# Patient Record
Sex: Male | Born: 1960 | Race: White | Hispanic: No | Marital: Married | Smoking: Never smoker
Health system: Southern US, Community
[De-identification: ages and names within clinical notes are randomized; demographics above are authoritative.]

---

## 2018-06-21 ENCOUNTER — Other Ambulatory Visit: Payer: Self-pay

## 2018-06-21 DIAGNOSIS — I10 Essential (primary) hypertension: Secondary | ICD-10-CM

## 2018-06-21 MED ORDER — HYDROCHLOROTHIAZIDE 50 MG PO TABS: 50.0000 mg | ORAL_TABLET | Freq: Every day | ORAL | 1 refills | Status: DC

## 2018-06-21 NOTE — Telephone Encounter (Signed)
You saw him 03/12/2018 for his blood pressure.  Last physical 09/12/2017 & last labs 03/07/2018.

## 2018-07-08 ENCOUNTER — Other Ambulatory Visit: Payer: Self-pay

## 2018-07-08 DIAGNOSIS — I10 Essential (primary) hypertension: Secondary | ICD-10-CM

## 2018-07-08 MED ORDER — HYDROCHLOROTHIAZIDE 50 MG PO TABS: 50.0000 mg | ORAL_TABLET | Freq: Every day | ORAL | 1 refills | Status: DC

## 2018-07-21 DIAGNOSIS — Z20828 Contact with and (suspected) exposure to other viral communicable diseases: Secondary | ICD-10-CM | POA: Diagnosis not present

## 2018-09-18 ENCOUNTER — Ambulatory Visit: Payer: Managed Care, Other (non HMO)

## 2018-09-18 ENCOUNTER — Other Ambulatory Visit: Payer: Self-pay

## 2018-09-18 DIAGNOSIS — Z01818 Encounter for other preprocedural examination: Secondary | ICD-10-CM

## 2018-09-18 LAB — POCT URINALYSIS DIPSTICK
Bilirubin, UA: NEGATIVE
Blood, UA: NEGATIVE
Glucose, UA: NEGATIVE
Ketones, UA: NEGATIVE
Leukocytes, UA: NEGATIVE
Nitrite, UA: NEGATIVE
Protein, UA: NEGATIVE
Spec Grav, UA: 1.02 (ref 1.010–1.025)
Urobilinogen, UA: 0.2 E.U./dL
pH, UA: 6 (ref 5.0–8.0)

## 2018-09-22 LAB — CMP12+LP+TP+TSH+6AC+PSA+CBC…
ALT: 39 IU/L (ref 0–44)
AST: 30 IU/L (ref 0–40)
Albumin/Globulin Ratio: 1.4 (ref 1.2–2.2)
Albumin: 4 g/dL (ref 3.8–4.9)
Alkaline Phosphatase: 63 IU/L (ref 39–117)
BUN/Creatinine Ratio: 10 (ref 9–20)
BUN: 10 mg/dL (ref 6–24)
Basophils Absolute: 0.1 10*3/uL (ref 0.0–0.2)
Basos: 1 %
Bilirubin Total: 0.4 mg/dL (ref 0.0–1.2)
Calcium: 8.9 mg/dL (ref 8.7–10.2)
Chloride: 99 mmol/L (ref 96–106)
Chol/HDL Ratio: 4 ratio (ref 0.0–5.0)
Cholesterol, Total: 135 mg/dL (ref 100–199)
Creatinine, Ser: 0.97 mg/dL (ref 0.76–1.27)
EOS (ABSOLUTE): 0.1 10*3/uL (ref 0.0–0.4)
Eos: 2 %
Estimated CHD Risk: 0.7 times avg. (ref 0.0–1.0)
Free Thyroxine Index: 2 (ref 1.2–4.9)
GFR calc Af Amer: 99 mL/min/{1.73_m2} (ref >59)
GFR calc non Af Amer: 86 mL/min/{1.73_m2} (ref >59)
GGT: 23 IU/L (ref 0–65)
Globulin, Total: 2.9 g/dL (ref 1.5–4.5)
Glucose: 108 mg/dL — ABNORMAL HIGH (ref 65–99)
HDL: 34 mg/dL — ABNORMAL LOW (ref >39)
Hematocrit: 48 % (ref 37.5–51.0)
Hemoglobin: 16.1 g/dL (ref 13.0–17.7)
Immature Grans (Abs): 0 10*3/uL (ref 0.0–0.1)
Immature Granulocytes: 0 %
Iron: 54 ug/dL (ref 38–169)
LDH: 197 IU/L (ref 121–224)
LDL Chol Calc (NIH): 89 mg/dL (ref 0–99)
Lymphocytes Absolute: 2 10*3/uL (ref 0.7–3.1)
Lymphs: 24 %
MCH: 29.5 pg (ref 26.6–33.0)
MCHC: 33.5 g/dL (ref 31.5–35.7)
MCV: 88 fL (ref 79–97)
Monocytes Absolute: 0.6 10*3/uL (ref 0.1–0.9)
Monocytes: 8 %
Neutrophils Absolute: 5.5 10*3/uL (ref 1.4–7.0)
Neutrophils: 65 %
Phosphorus: 3.7 mg/dL (ref 2.8–4.1)
Platelets: 174 10*3/uL (ref 150–450)
Potassium: 3.5 mmol/L (ref 3.5–5.2)
Prostate Specific Ag, Serum: 5 ng/mL — ABNORMAL HIGH (ref 0.0–4.0)
RBC: 5.45 x10E6/uL (ref 4.14–5.80)
RDW: 13.4 % (ref 11.6–15.4)
Sodium: 139 mmol/L (ref 134–144)
T3 Uptake Ratio: 27 % (ref 24–39)
T4, Total: 7.3 ug/dL (ref 4.5–12.0)
TSH: 1.12 u[IU]/mL (ref 0.450–4.500)
Total Protein: 6.9 g/dL (ref 6.0–8.5)
Triglycerides: 53 mg/dL (ref 0–149)
Uric Acid: 6.1 mg/dL (ref 3.7–8.6)
VLDL Cholesterol Cal: 12 mg/dL (ref 5–40)
WBC: 8.4 10*3/uL (ref 3.4–10.8)

## 2018-09-22 LAB — TESTOSTERONE,FREE AND TOTAL
Testosterone, Free: 18.3 pg/mL (ref 7.2–24.0)
Testosterone: 778 ng/dL (ref 264–916)

## 2018-09-24 ENCOUNTER — Encounter: Payer: Self-pay | Admitting: Internal Medicine

## 2018-09-24 ENCOUNTER — Other Ambulatory Visit: Payer: Self-pay

## 2018-09-24 ENCOUNTER — Ambulatory Visit: Payer: Managed Care, Other (non HMO) | Admitting: Internal Medicine

## 2018-09-24 VITALS — BP 142/77 | HR 66 | Temp 98.1°F | Resp 12 | Ht 69.0 in | Wt 267.0 lb

## 2018-09-24 DIAGNOSIS — J309 Allergic rhinitis, unspecified: Secondary | ICD-10-CM | POA: Insufficient documentation

## 2018-09-24 DIAGNOSIS — Z024 Encounter for examination for driving license: Secondary | ICD-10-CM | POA: Insufficient documentation

## 2018-09-24 DIAGNOSIS — R7301 Impaired fasting glucose: Secondary | ICD-10-CM | POA: Insufficient documentation

## 2018-09-24 DIAGNOSIS — I10 Essential (primary) hypertension: Secondary | ICD-10-CM | POA: Insufficient documentation

## 2018-09-24 DIAGNOSIS — H9192 Unspecified hearing loss, left ear: Secondary | ICD-10-CM | POA: Insufficient documentation

## 2018-09-24 DIAGNOSIS — R972 Elevated prostate specific antigen [PSA]: Secondary | ICD-10-CM | POA: Insufficient documentation

## 2018-09-24 DIAGNOSIS — Z86010 Personal history of colonic polyps: Secondary | ICD-10-CM

## 2018-09-24 DIAGNOSIS — E669 Obesity, unspecified: Secondary | ICD-10-CM | POA: Insufficient documentation

## 2018-09-24 DIAGNOSIS — E291 Testicular hypofunction: Secondary | ICD-10-CM | POA: Insufficient documentation

## 2018-09-24 NOTE — Progress Notes (Signed)
S  - Patient is a 58 y.o. 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 His DOT was ok'ed for 3 months last September due to higher BP's and then re-assessed in November and was certified for one year.  He was seen 03/12/2018 with his BP higher that visit and losartan 25 mg daily added to his regimen of HCTZ (and on K+ supp as well then but not presently). Also educated on importance of lifestyle changes needed with diet, exercise and weight loss as was obese, had prediabetes dx'ed in addition to his HTN An initial f/u 4-6 weeks after that visit was delayed due to Covid, and had not f/u'ed since that March visit.  His PSA was noted to increase on assessment last September and the testosterone supp (gel) was rec'ed to alternate 2 pumps with 3 pumps every other [place] by the provider here at that time. That was not done and he remains on three pumps daily. A follow-up testosterone was not done until the recent labs.   All in all, he has been feeling well. Notes occas LBP's not limiting.  No specific complaints, denies any recent CP, palpitations, SOB, abdominal pains, change in bowel habits, dark/black stools, vision changes (uses reading glasses), recent fevers, or other Covid concerning sx's, denies up at night to urinate frequently, no hesitancy, no LE swelling.  Health history extensive on DOT form and reviewed - toe fracture over 40 years ago noted.  Exercise - no regular exercise Diet - notes tries to watch and eat healthy, not adherent to a very healthy diet and weight has been about the same last 6 months  Allergies  Allergen Reactions  . Naproxen Sodium Nausea Only    Meds reviewed Current Outpatient Medications on File Prior to Visit  Medication Sig Dispense Refill  . hydrochlorothiazide (HYDRODIURIL) 50 MG tablet Take 1 tablet (50 mg total) by mouth daily. 90 tablet 1  . losartan (COZAAR) 25 MG tablet TK 1 T PO QD    . Testosterone 20.25 MG/ACT (1.62%) GEL APP 3 PUMPS QAM  ALTERNATING WITH 2 PUMPS QAM     No current facility-administered medications on file prior to visit.     No tob hx Alcohol use - noted a couple beers/week (trying to decrease some to help with weight), CAGE neg    O - NAD, masked, obese  BP (!) 142/77 (BP Location: Left Arm, Patient Position: Sitting, Cuff Size: Large)   Pulse 66   Temp 98.1 F (36.7 C) (Oral)   Resp 12   Ht 5\' 9"  (1.753 m)   Wt 267 lb (121.1 kg)   SpO2 98%   BMI 39.43 kg/m   Recheck BP - 138/78 manually by me  BP in March - 138/90 manually by me Weight in March - 267.4  Acuity - right 20/40, left 20/30, Both - 20/40 via testing, horizontal field of vision - 80 degrees HEENT - sclera anicteric, PERRL, EOMI, conj - non-inj'ed, No sinus tenderness, TM's and canals clear except mild cerumen noted in canals bilat, pharynx clear  Neck - supple, no adenopathy, no TM, carotids 2+ and = without bruits bilat Car - RRR without m/g/r Pulm- CTA without wheeze or rales Abd - soft, obese, NT, ND, BS+, no obvious HSM, no masses, + ventral hernia (not new), not tender Back - no CVA tenderness Skin- no new lesions of concern on exposed areas, denied otherwise with a mole on his central back without concerning changes noted, scar left knee Ext -  no LE edema, no active joints GU - no swelling in inguinal/suprapubic region, NT, no ing hernia, no testicle lumps or tenderness, prostate exam deferred as will be done by urology on referral with increased PSA noted Neuro - affect was not flat, appropriate with conversation  Grossly non-focal with good strength on testing, sensation intact to LT in distal extremities, DTR's 2+ and = patella, Romberg neg, no pronator drift, good balance on one foot, good finger to nose, good RAMs  Labs reviewed - glu - 108, HDL - 34, TC - 135, LDL - 89, PSA - 5.0 (3.9 in Sept, 2019), H/H - 16.1/48.0, test - 778, free test - 18.3 Hearing screen - OK at low frequencies, hearing decrease at higher freq's  in left ear Vision screen - acuity as above  ECG reviewed - no concerning changes from prior ECG  Colonoscopy screening reviewed - March 2019 last - + serrated adenoma fragments and tubular adenoma, F/U rec'ed in 3 years  Ass/Plan: 1. DOT exam - paperwork completed, copy to be scanned into chart  Certificate given for one year (not two due to HTN)  2. HTN - controlled on medicines at present  Continue medications to manage Discussed goals for good control of BP  Importance of healthy diet and regular aerobic exercise and weight control noted Continue to monitor with K+ low normal and was lower int he past, felt bext to check a BMP in 3 months to recheck  3.  IFG  (prediabetes)  Educated as did last visit as well Importance of diet, aerobic exercise noted, to help with weight management as well  4.  Increased BMI/obesity  Importance of diet modifications and regular aerobic exercise noted Weight loss encouraged  5. Hypogonadism - on test supp, gel   Concern with levels at high ends of normal and concern with increased PSA noted Decrease testosterone to 2 pumps daily at present Await urology rec  6. Increased PSA - referral to urology to help with best next steps, especially noting the increased risks given the above and he was ok with proceeding.  7. Colon polyps - adenomatous tissue noted, rec'ed f/u in 3 years (March 2022)  8. Left hearing loss, not new and lower frequencies ok.   T/C hearing aid eval if more concerning clinically (Ok to qualify for DOT cert)  9. Visual acuity slightly decreased - rec'ed f/u with an eye MD to have further assessment (was ok to qualify for DOT cert.), and he noted he planned to do so (with a different eye MD)  F/u in 3 months with check of a BMP to make sure K+ stable and also can recheck testosterone and free testosterone on the lower dose and await urology input.

## 2018-09-24 NOTE — Patient Instructions (Signed)
Decrease testosterone dose to 2 pumps daily  Follow-up with urology recommended

## 2018-10-07 DIAGNOSIS — Z79899 Other long term (current) drug therapy: Secondary | ICD-10-CM | POA: Diagnosis not present

## 2018-10-07 DIAGNOSIS — E669 Obesity, unspecified: Secondary | ICD-10-CM | POA: Diagnosis not present

## 2018-10-07 DIAGNOSIS — R972 Elevated prostate specific antigen [PSA]: Secondary | ICD-10-CM | POA: Diagnosis not present

## 2018-10-07 DIAGNOSIS — E291 Testicular hypofunction: Secondary | ICD-10-CM | POA: Diagnosis not present

## 2018-10-08 ENCOUNTER — Other Ambulatory Visit: Payer: Self-pay | Admitting: Urology

## 2018-10-08 DIAGNOSIS — R972 Elevated prostate specific antigen [PSA]: Secondary | ICD-10-CM

## 2018-10-16 ENCOUNTER — Other Ambulatory Visit: Payer: Self-pay

## 2018-10-16 ENCOUNTER — Ambulatory Visit
Admission: RE | Admit: 2018-10-16 | Discharge: 2018-10-16 | Disposition: A | Discharge status: Home / Self Care | Payer: 59 | Source: Ambulatory Visit | Attending: Urology | Admitting: Urology

## 2018-10-16 DIAGNOSIS — R972 Elevated prostate specific antigen [PSA]: Secondary | ICD-10-CM | POA: Insufficient documentation

## 2018-10-16 LAB — POCT I-STAT CREATININE: Creatinine, Ser: 1 mg/dL (ref 0.61–1.24)

## 2018-10-16 MED ORDER — GADOBUTROL 1 MMOL/ML IV SOLN
10.0000 mL | Freq: Once | INTRAVENOUS | Status: AC | PRN
  Administered 2018-10-16: 10 mL via INTRAVENOUS

## 2018-10-21 DIAGNOSIS — E291 Testicular hypofunction: Secondary | ICD-10-CM | POA: Diagnosis not present

## 2018-10-21 DIAGNOSIS — R972 Elevated prostate specific antigen [PSA]: Secondary | ICD-10-CM | POA: Diagnosis not present

## 2018-10-21 DIAGNOSIS — N401 Enlarged prostate with lower urinary tract symptoms: Secondary | ICD-10-CM | POA: Diagnosis not present

## 2018-10-21 DIAGNOSIS — Z79899 Other long term (current) drug therapy: Secondary | ICD-10-CM | POA: Diagnosis not present

## 2018-12-17 ENCOUNTER — Other Ambulatory Visit: Payer: Managed Care, Other (non HMO)

## 2018-12-18 ENCOUNTER — Other Ambulatory Visit: Payer: Self-pay

## 2018-12-18 DIAGNOSIS — E291 Testicular hypofunction: Secondary | ICD-10-CM

## 2018-12-18 DIAGNOSIS — R972 Elevated prostate specific antigen [PSA]: Secondary | ICD-10-CM

## 2018-12-18 NOTE — Progress Notes (Addendum)
Scheduled to follow-up with an interim provider on 01/07/2019. We don't have the [facility] Interim Provider schedule for January 2021, so I didn't list the specific provider's name in this note.  Italo informed me that he hasn't been using testosterone get for past 6 weeks due to elevated PSA.  States he's seeing Maryan Puls, MD of [place]. Requested to add PSA & have lab results sent to Dr. Yves Dill.  AMD

## 2018-12-18 NOTE — Addendum Note (Signed)
Addended by: Aliene Altes on: 12/18/2018 08:58 AM   Modules accepted: Orders

## 2018-12-20 LAB — CBC WITH DIFFERENTIAL/PLATELET
Basophils Absolute: 0.1 10*3/uL (ref 0.0–0.2)
Basos: 1 %
EOS (ABSOLUTE): 0.1 10*3/uL (ref 0.0–0.4)
Eos: 2 %
Hematocrit: 45.7 % (ref 37.5–51.0)
Hemoglobin: 16.1 g/dL (ref 13.0–17.7)
Immature Grans (Abs): 0 10*3/uL (ref 0.0–0.1)
Immature Granulocytes: 0 %
Lymphocytes Absolute: 2 10*3/uL (ref 0.7–3.1)
Lymphs: 25 %
MCH: 30.6 pg (ref 26.6–33.0)
MCHC: 35.2 g/dL (ref 31.5–35.7)
MCV: 87 fL (ref 79–97)
Monocytes Absolute: 0.5 10*3/uL (ref 0.1–0.9)
Monocytes: 7 %
Neutrophils Absolute: 5.5 10*3/uL (ref 1.4–7.0)
Neutrophils: 65 %
Platelets: 191 10*3/uL (ref 150–450)
RBC: 5.27 x10E6/uL (ref 4.14–5.80)
RDW: 12.6 % (ref 11.6–15.4)
WBC: 8.3 10*3/uL (ref 3.4–10.8)

## 2018-12-20 LAB — TESTOSTERONE,FREE AND TOTAL
Testosterone, Free: 4.3 pg/mL — ABNORMAL LOW (ref 7.2–24.0)
Testosterone: 195 ng/dL — ABNORMAL LOW (ref 264–916)

## 2019-01-02 ENCOUNTER — Other Ambulatory Visit: Payer: Self-pay

## 2019-01-02 DIAGNOSIS — I10 Essential (primary) hypertension: Secondary | ICD-10-CM

## 2019-01-02 MED ORDER — HYDROCHLOROTHIAZIDE 50 MG PO TABS: 50.0000 mg | ORAL_TABLET | Freq: Every day | ORAL | 1 refills | Status: DC

## 2019-01-07 ENCOUNTER — Other Ambulatory Visit: Payer: Self-pay

## 2019-01-07 ENCOUNTER — Ambulatory Visit: Payer: Self-pay | Admitting: Occupational Medicine

## 2019-01-07 ENCOUNTER — Encounter: Payer: Self-pay | Admitting: Occupational Medicine

## 2019-01-07 VITALS — BP 122/84 | HR 62 | Temp 98.3°F | Resp 12 | Ht 69.0 in | Wt 273.0 lb

## 2019-01-07 NOTE — Progress Notes (Signed)
Epic shows the 12/18/2018 PSA order (# [id number])  but not completed.  Contacted [facility] Customer Service Brett Canales) & they couldn't find the results.  They have the requisition, but failed to complete the test.  No charge for this .  Lab results faxed to Dr. Evelene Croon.  PSA recollected at today's visit.  At the time of the 12/18/2018 lab visit, patient hadn't used Testosterone gel for 6 weeks due to elevated PSA. At today's visit & re-collection he is 9 weeks without the use of testosterone supplementation.  AMD

## 2019-01-09 ENCOUNTER — Encounter: Payer: Self-pay | Admitting: Physician Assistant

## 2019-01-09 ENCOUNTER — Ambulatory Visit: Payer: Self-pay | Admitting: Physician Assistant

## 2019-01-09 ENCOUNTER — Other Ambulatory Visit: Payer: Self-pay

## 2019-01-09 VITALS — BP 150/70 | HR 69 | Temp 99.2°F | Resp 16 | Ht 69.0 in | Wt 273.0 lb

## 2019-01-09 DIAGNOSIS — R972 Elevated prostate specific antigen [PSA]: Secondary | ICD-10-CM

## 2019-01-09 LAB — PSA: Prostate Specific Ag, Serum: 3.8 ng/mL (ref 0.0–4.0)

## 2019-01-09 NOTE — Progress Notes (Signed)
Dr. Anola Gurney is his Urologist. All lab results have been sent to him.  AMD

## 2019-01-09 NOTE — Progress Notes (Signed)
  Subjective:     Patient ID: Paul Pitts, male   DOB: [birth year], 59 y.o.   MRN: [id number]  HPI  59 yo M for lab review Had elevated PSA 5.0 noted 3 months ago with referral to  Urology Dr Evelene Croon for evaluation. It was recognized that he was applying testosterone cream at a significantly higher amount repeatedly.  Feels tired and no energy No exercise     273  pounds Review of Systems     Objective:   Physical Exam VSS Alert and interactive , understands lab concern    Assessment:     PSA 3.8     Plan:     Dr Evelene Croon  To be notified of new lab results Patient to call for dosing instructions  We can draw follow up lab levels as requested  Schedule 3 months f/u

## 2019-01-22 ENCOUNTER — Encounter: Payer: Self-pay | Admitting: Physician Assistant

## 2019-02-04 DIAGNOSIS — Z20828 Contact with and (suspected) exposure to other viral communicable diseases: Secondary | ICD-10-CM | POA: Diagnosis not present

## 2019-03-17 ENCOUNTER — Other Ambulatory Visit: Payer: Self-pay

## 2019-03-17 DIAGNOSIS — I10 Essential (primary) hypertension: Secondary | ICD-10-CM

## 2019-03-17 MED ORDER — LOSARTAN POTASSIUM 25 MG PO TABS: 25.0000 mg | ORAL_TABLET | Freq: Every day | ORAL | 1 refills | Status: DC

## 2019-04-21 DIAGNOSIS — R972 Elevated prostate specific antigen [PSA]: Secondary | ICD-10-CM | POA: Diagnosis not present

## 2019-04-21 DIAGNOSIS — N401 Enlarged prostate with lower urinary tract symptoms: Secondary | ICD-10-CM | POA: Diagnosis not present

## 2019-04-21 DIAGNOSIS — E291 Testicular hypofunction: Secondary | ICD-10-CM | POA: Diagnosis not present

## 2019-04-21 DIAGNOSIS — E6609 Other obesity due to excess calories: Secondary | ICD-10-CM | POA: Diagnosis not present

## 2019-04-21 DIAGNOSIS — Z79899 Other long term (current) drug therapy: Secondary | ICD-10-CM | POA: Diagnosis not present

## 2019-07-09 ENCOUNTER — Other Ambulatory Visit: Payer: Self-pay

## 2019-07-09 DIAGNOSIS — I10 Essential (primary) hypertension: Secondary | ICD-10-CM

## 2019-07-10 MED ORDER — HYDROCHLOROTHIAZIDE 50 MG PO TABS: 50.0000 mg | ORAL_TABLET | Freq: Every day | ORAL | 1 refills | Status: DC

## 2019-09-13 ENCOUNTER — Other Ambulatory Visit: Payer: Self-pay | Admitting: Physician Assistant

## 2019-09-13 DIAGNOSIS — I10 Essential (primary) hypertension: Secondary | ICD-10-CM

## 2019-09-25 DIAGNOSIS — H40003 Preglaucoma, unspecified, bilateral: Secondary | ICD-10-CM | POA: Diagnosis not present

## 2019-10-13 ENCOUNTER — Telehealth: Payer: Self-pay

## 2019-10-13 MED ORDER — HYDROCHLOROTHIAZIDE 50 MG PO TABS: 50.0000 mg | ORAL_TABLET | Freq: Every day | ORAL | 2 refills | Status: DC

## 2019-10-13 NOTE — Telephone Encounter (Signed)
Rx refill request already in Epic - routed to [employer] [employer].  AMD

## 2019-10-22 ENCOUNTER — Other Ambulatory Visit: Payer: Self-pay

## 2019-10-22 ENCOUNTER — Ambulatory Visit: Payer: 59

## 2019-10-22 VITALS — BP 130/84

## 2019-10-22 DIAGNOSIS — E291 Testicular hypofunction: Secondary | ICD-10-CM | POA: Diagnosis not present

## 2019-10-22 DIAGNOSIS — R972 Elevated prostate specific antigen [PSA]: Secondary | ICD-10-CM | POA: Diagnosis not present

## 2019-10-22 DIAGNOSIS — Z0130 Encounter for examination of blood pressure without abnormal findings: Secondary | ICD-10-CM

## 2019-10-22 DIAGNOSIS — N401 Enlarged prostate with lower urinary tract symptoms: Secondary | ICD-10-CM | POA: Diagnosis not present

## 2019-10-29 NOTE — Progress Notes (Signed)
Needs to schedule appt with Provider to complete physical.

## 2019-10-30 ENCOUNTER — Other Ambulatory Visit: Payer: Self-pay

## 2019-10-30 ENCOUNTER — Ambulatory Visit: Payer: Self-pay

## 2019-10-30 DIAGNOSIS — E291 Testicular hypofunction: Secondary | ICD-10-CM

## 2019-10-30 DIAGNOSIS — Z01818 Encounter for other preprocedural examination: Secondary | ICD-10-CM

## 2019-10-30 LAB — POCT URINALYSIS DIPSTICK
Bilirubin, UA: NEGATIVE
Blood, UA: NEGATIVE
Glucose, UA: NEGATIVE
Ketones, UA: NEGATIVE
Leukocytes, UA: NEGATIVE
Nitrite, UA: NEGATIVE
Protein, UA: NEGATIVE
Spec Grav, UA: 1.015 (ref 1.010–1.025)
Urobilinogen, UA: 0.2 E.U./dL
pH, UA: 6 (ref 5.0–8.0)

## 2019-10-30 NOTE — Progress Notes (Signed)
RN to schedule visit for employee physical.

## 2019-10-31 LAB — CMP12+LP+TP+TSH+6AC+PSA+CBC…
ALT: 48 IU/L — ABNORMAL HIGH (ref 0–44)
AST: 35 IU/L (ref 0–40)
Albumin/Globulin Ratio: 1.6 (ref 1.2–2.2)
Albumin: 4.4 g/dL (ref 3.8–4.9)
Alkaline Phosphatase: 82 IU/L (ref 44–121)
BUN/Creatinine Ratio: 11 (ref 10–24)
BUN: 10 mg/dL (ref 8–27)
Basophils Absolute: 0 10*3/uL (ref 0.0–0.2)
Basos: 0 %
Bilirubin Total: 0.6 mg/dL (ref 0.0–1.2)
Calcium: 9.4 mg/dL (ref 8.6–10.2)
Chloride: 95 mmol/L — ABNORMAL LOW (ref 96–106)
Chol/HDL Ratio: 4.6 ratio (ref 0.0–5.0)
Cholesterol, Total: 190 mg/dL (ref 100–199)
Creatinine, Ser: 0.94 mg/dL (ref 0.76–1.27)
EOS (ABSOLUTE): 0.1 10*3/uL (ref 0.0–0.4)
Eos: 1 %
Estimated CHD Risk: 0.9 times avg. (ref 0.0–1.0)
Free Thyroxine Index: 1.9 (ref 1.2–4.9)
GFR calc Af Amer: 101 mL/min/{1.73_m2} (ref >59)
GFR calc non Af Amer: 88 mL/min/{1.73_m2} (ref >59)
GGT: 30 IU/L (ref 0–65)
Globulin, Total: 2.7 g/dL (ref 1.5–4.5)
Glucose: 119 mg/dL — ABNORMAL HIGH (ref 65–99)
HDL: 41 mg/dL (ref >39)
Hematocrit: 47 % (ref 37.5–51.0)
Hemoglobin: 16.3 g/dL (ref 13.0–17.7)
Immature Grans (Abs): 0 10*3/uL (ref 0.0–0.1)
Immature Granulocytes: 0 %
Iron: 80 ug/dL (ref 38–169)
LDH: 224 IU/L (ref 121–224)
LDL Chol Calc (NIH): 132 mg/dL — ABNORMAL HIGH (ref 0–99)
Lymphocytes Absolute: 2.6 10*3/uL (ref 0.7–3.1)
Lymphs: 27 %
MCH: 30 pg (ref 26.6–33.0)
MCHC: 34.7 g/dL (ref 31.5–35.7)
MCV: 86 fL (ref 79–97)
Monocytes Absolute: 0.7 10*3/uL (ref 0.1–0.9)
Monocytes: 8 %
Neutrophils Absolute: 6 10*3/uL (ref 1.4–7.0)
Neutrophils: 64 %
Phosphorus: 3.3 mg/dL (ref 2.8–4.1)
Platelets: 193 10*3/uL (ref 150–450)
Potassium: 3.6 mmol/L (ref 3.5–5.2)
Prostate Specific Ag, Serum: 5 ng/mL — ABNORMAL HIGH (ref 0.0–4.0)
RBC: 5.44 x10E6/uL (ref 4.14–5.80)
RDW: 13.1 % (ref 11.6–15.4)
Sodium: 136 mmol/L (ref 134–144)
T3 Uptake Ratio: 24 % (ref 24–39)
T4, Total: 7.9 ug/dL (ref 4.5–12.0)
TSH: 2.02 u[IU]/mL (ref 0.450–4.500)
Total Protein: 7.1 g/dL (ref 6.0–8.5)
Triglycerides: 92 mg/dL (ref 0–149)
Uric Acid: 6.8 mg/dL (ref 3.8–8.4)
VLDL Cholesterol Cal: 17 mg/dL (ref 5–40)
WBC: 9.6 10*3/uL (ref 3.4–10.8)

## 2019-10-31 LAB — TESTOSTERONE,FREE AND TOTAL
Testosterone, Free: 6.8 pg/mL (ref 6.6–18.1)
Testosterone: 178 ng/dL — ABNORMAL LOW (ref 264–916)

## 2019-11-06 ENCOUNTER — Ambulatory Visit: Payer: Self-pay | Admitting: Physician Assistant

## 2019-11-06 ENCOUNTER — Encounter: Payer: Self-pay | Admitting: Physician Assistant

## 2019-11-06 ENCOUNTER — Other Ambulatory Visit: Payer: Self-pay

## 2019-11-06 VITALS — BP 138/80 | HR 70 | Temp 97.9°F | Resp 16 | Ht 69.0 in | Wt 268.0 lb

## 2019-11-06 DIAGNOSIS — R7309 Other abnormal glucose: Secondary | ICD-10-CM

## 2019-11-06 LAB — POCT GLYCOSYLATED HEMOGLOBIN (HGB A1C): Hemoglobin A1C: 6.7 % — AB (ref 4.0–5.6)

## 2019-11-06 MED ORDER — CYCLOBENZAPRINE HCL 10 MG PO TABS: 10.0000 mg | ORAL_TABLET | Freq: Every day | ORAL | 0 refills | Status: DC

## 2019-11-06 MED ORDER — LIDOCAINE 5 % EX PTCH: 1.0000 | MEDICATED_PATCH | CUTANEOUS | 0 refills | Status: DC

## 2019-11-06 NOTE — Progress Notes (Signed)
   Subjective: Annual physical exam    Patient ID: Paul Pitts, male    DOB: [birth year], 59 y.o.   MRN: [id number]  HPI Patient presents for annual physical exam.  Patient complain of left lateral back pain.  Patient has bladder bowel dysfunction.  Patient denies radicular component to his back pain.   Review of Systems    Hypertension Objective:   Physical Exam No acute distress.  BMI is 39.58.  HEENT is unremarkable.  Neck is supple for adenopathy or bruits.  Lungs are clear to auscultation.  Heart regular rate and rhythm.  Abdomen distended secondary to body habitus.  Normoactive bowel sounds, soft, nontender to palpation.  No obvious deformity to the upper or lower extremities.  Patient has full and equal range of motion of the upper and lower extremities.  No obvious deformity to the cervical lumbar spine.  Patient has full and equal range of motion of the cervical lumbar spine.  Cranial nerves II through XII grossly intact.       Assessment & Plan: Well exam  Discussed lab results with patient showing a PSA of 5.0 and a glucose of 119.  Hemoglobin A1c was 6.7.  Patient elected to trial 6 months diet exercise for hyperglycemia.  Patient advised to follow-up with schedule appointment with urology secondary to elevated PSA.  Patient given prescription for Flexeril to take at night.  Patient also given prescription for Lidoderm patch to use during the [place].  Return if back pain worsen.

## 2019-11-06 NOTE — Progress Notes (Signed)
Pt presents today to complete physical with [name] PA-C.  Paul Pitts

## 2019-12-04 DIAGNOSIS — G4733 Obstructive sleep apnea (adult) (pediatric): Secondary | ICD-10-CM | POA: Diagnosis not present

## 2020-01-20 DIAGNOSIS — G4733 Obstructive sleep apnea (adult) (pediatric): Secondary | ICD-10-CM | POA: Diagnosis not present

## 2020-01-22 ENCOUNTER — Other Ambulatory Visit: Payer: Self-pay

## 2020-01-22 DIAGNOSIS — I10 Essential (primary) hypertension: Secondary | ICD-10-CM

## 2020-01-23 ENCOUNTER — Other Ambulatory Visit: Payer: Self-pay

## 2020-01-23 DIAGNOSIS — I10 Essential (primary) hypertension: Secondary | ICD-10-CM

## 2020-01-23 MED ORDER — HYDROCHLOROTHIAZIDE 50 MG PO TABS: 50.0000 mg | ORAL_TABLET | Freq: Every day | ORAL | 2 refills | Status: DC

## 2020-01-23 MED ORDER — LOSARTAN POTASSIUM 25 MG PO TABS: 25.0000 mg | ORAL_TABLET | Freq: Every day | ORAL | 2 refills | Status: DC

## 2020-02-05 DIAGNOSIS — Z20822 Contact with and (suspected) exposure to covid-19: Secondary | ICD-10-CM | POA: Diagnosis not present

## 2020-02-16 ENCOUNTER — Ambulatory Visit: Payer: Self-pay

## 2020-02-16 DIAGNOSIS — Z20822 Contact with and (suspected) exposure to covid-19: Secondary | ICD-10-CM

## 2020-02-16 NOTE — Progress Notes (Signed)
Presents to [facility] for on-site rapid Covid test.  Wife tested positive 1 1/2 weeks ago.  OOW last week on quarantine.  Had a Covid test at Urgent Care that was negative.  Asymptomatic  Unvaccinated  Rapid Covid Test Results = Negative  AMD

## 2020-02-17 ENCOUNTER — Other Ambulatory Visit: Payer: Self-pay

## 2020-02-17 ENCOUNTER — Encounter: Payer: Self-pay | Admitting: Adult Medicine

## 2020-02-17 ENCOUNTER — Ambulatory Visit: Payer: Self-pay | Admitting: Adult Medicine

## 2020-02-17 VITALS — BP 130/68 | HR 62 | Temp 97.9°F | Resp 14 | Ht 69.0 in | Wt 255.0 lb

## 2020-02-17 DIAGNOSIS — M5410 Radiculopathy, site unspecified: Secondary | ICD-10-CM

## 2020-02-17 DIAGNOSIS — M5442 Lumbago with sciatica, left side: Secondary | ICD-10-CM

## 2020-02-17 MED ORDER — CARISOPRODOL 350 MG PO TABS: 350.0000 mg | ORAL_TABLET | Freq: Four times a day (QID) | ORAL | 0 refills | Status: DC | PRN

## 2020-02-17 MED ORDER — LIDO-CAPSAICIN-MEN-METHYL SAL 0.5-0.035-5-20 % EX PTCH: 1.0000 | MEDICATED_PATCH | Freq: Two times a day (BID) | CUTANEOUS | 1 refills | Status: DC

## 2020-02-17 NOTE — Progress Notes (Unsigned)
Testosterone 264 - 916 ng/dL 178Low  195Low CM  778 CM   Comment: Adult male reference interval is based on a population of  healthy nonobese males (BMI <30) between 16 and 61 years old.  [place], [place] [phone]. PMID: [id number].   Testosterone, Free 6.6 - 18.1 pg/mL 6.8  4.3Low R  18.3 R      Component Ref Range & Units 3 mo ago  (10/30/19) 1 yr ago  (01/07/19) 1 yr ago  (12/18/18) 1 yr ago  (10/16/18) 1 yr ago  (09/18/18)  Glucose 65 - 99 mg/dL 119High     108High   Uric Acid 3.8 - 8.4 mg/dL 6.8     6.1 R, CM   Comment:      Therapeutic target for gout patients: <6.0  BUN 8 - 27 mg/dL 10     10 R   Creatinine, Ser 0.76 - 1.27 mg/dL 0.94    1.00 R  0.97   GFR calc non Af Amer >59 mL/min/1.73 88     86   GFR calc Af Amer >59 mL/min/1.73 101     99   Comment: **In accordance with recommendations from the NKF-ASN Task force,**   [facility] is in the process of updating its eGFR calculation to the   2021 CKD-EPI creatinine equation that estimates kidney function   without a race variable.   BUN/Creatinine Ratio 10 - 24 11     10  R   Sodium 134 - 144 mmol/L 136     139   Potassium 3.5 - 5.2 mmol/L 3.6     3.5   Chloride 96 - 106 mmol/L 95Low     99   Calcium 8.6 - 10.2 mg/dL 9.4     8.9 R   Phosphorus 2.8 - 4.1 mg/dL 3.3     3.7   Total Protein 6.0 - 8.5 g/dL 7.1     6.9   Albumin 3.8 - 4.9 g/dL 4.4     4.0   Globulin, Total 1.5 - 4.5 g/dL 2.7     2.9   Albumin/Globulin Ratio 1.2 - 2.2 1.6     1.4   Bilirubin Total 0.0 - 1.2 mg/dL 0.6     0.4   Alkaline Phosphatase 44 - 121 IU/L 82     63 R   Comment:       **Please note reference interval change**  LDH 121 - 224 IU/L 224     197   AST 0 - 40 IU/L 35     30   ALT 0 - 44 IU/L 48High     39   GGT 0 - 65 IU/L 30     23   Iron 38 - 169 ug/dL 80     54   Cholesterol, Total 100 - 199 mg/dL 190     135   Triglycerides 0 - 149 mg/dL 92     53   HDL >39 mg/dL 41     34Low   VLDL  Cholesterol Cal 5 - 40 mg/dL 17     12   LDL Chol Calc (NIH) 0 - 99 mg/dL 132High     89   Chol/HDL Ratio 0.0 - 5.0 ratio 4.6     4.0 CM   Comment:                 T. Chol/HDL Ratio  Men Women                 1/2 Avg.Risk 3.4  3.3                   Avg.Risk 5.0  4.4                 2X Avg.Risk 9.6  7.1                 3X Avg.Risk 23.4  11.0   Estimated CHD Risk 0.0 - 1.0 times avg. 0.9     0.7 CM   Comment: The CHD Risk is based on the T. Chol/HDL ratio. Other  factors affect CHD Risk such as hypertension, smoking,  diabetes, severe obesity, and family history of  premature CHD.   TSH 0.450 - 4.500 uIU/mL 2.020     1.120   T4, Total 4.5 - 12.0 ug/dL 7.9     7.3   T3 Uptake Ratio 24 - 39 % 24     27   Free Thyroxine Index 1.2 - 4.9 1.9     2.0   Prostate Specific Ag, Serum 0.0 - 4.0 ng/mL 5.0High  3.8 CM    5.0High CM

## 2020-02-17 NOTE — Patient Instructions (Addendum)
Here is some added information alongside of your discussion with DrHolder for Chronic Back Pain When back pain lasts longer than 3 months, it is called chronic back pain. Pain may get worse at certain times (flare-ups). There are things you can do at home to manage your pain. Follow these instructions at home: Pay attention to any changes in your symptoms. Take these actions to help with your pain: Managing pain and stiffness  If told, put ice on the painful area. Your doctor may tell you to use ice for 24-48 hours after the flare-up starts. To do this: ? Put ice in a plastic bag. ? Place a towel between your skin and the bag. ? Leave the ice on for 20 minutes, 2-3 times a [place].  If told, put heat on the painful area. Do this as often as told by your doctor. Use the heat source that your doctor recommends, such as a moist heat pack or a heating pad. ? Place a towel between your skin and the heat source. ? Leave the heat on for 20-30 minutes. ? Take off the heat if your skin turns bright red. This is especially important if you are unable to feel pain, heat, or cold. You may have a greater risk of getting burned.  Soak in a warm bath. This can help relieve pain.      Activity  Avoid bending and other activities that make pain worse.  When standing: ? Keep your upper back and neck straight. ? Keep your shoulders pulled back. ? Avoid slouching.  When sitting: ? Keep your back straight. ? Relax your shoulders. Do not round your shoulders or pull them backward.  Do not sit or stand in one place for long periods of time.  Take short rest breaks during the [place]. Lying down or standing is usually better than sitting. Resting can help relieve pain.  When sitting or lying down for a long time, do some mild activity or stretching. This will help to prevent stiffness and pain.  Get regular exercise. Ask your doctor what activities are safe for you.  Do not lift anything that is heavier  than 10 lb (4.5 kg) or the limit that you are told, until your doctor says that it is safe.  To prevent injury when you lift things: ? Bend your knees. ? Keep the weight close to your body. ? Avoid twisting.     Medicines  Treatment may include medicines for pain and swelling taken by mouth or put on the skin, prescription pain medicine, or muscle relaxants.  Take over-the-counter and prescription medicines only as told by your doctor.  Ask your doctor if the medicine prescribed to you: ? Requires you to avoid driving or using machinery. ? Can cause trouble pooping (constipation). You may need to take these actions to prevent or treat trouble pooping:  Drink enough fluid to keep your pee (urine) pale yellow.  Take over-the-counter or prescription medicines.  Eat foods that are high in fiber. These include beans, whole grains, and fresh fruits and vegetables.  Limit foods that are high in fat and sugars. These include fried or sweet foods. General instructions  Do not use any products that contain nicotine or tobacco, such as cigarettes, e-cigarettes, and chewing tobacco. If you need help quitting, ask your doctor.  Keep all follow-up visits as told by your doctor. This is important.   Get help right away if:  One or both of your legs or feet feel  weak.  One or both of your legs or feet lose feeling (have numbness).  You have trouble controlling when you poop (have a bowel movement) or pee (urinate).  You have bad back pain and: ? You feel like you may vomit (nauseous), or you vomit. ? You have pain in your belly (abdomen). ? You have shortness of breath. ? You faint. Summary  When back pain lasts longer than 3 months, it is called chronic back pain.  Pain may get worse at certain times (flare-ups).  Use ice and heat as told by your doctor. Your doctor may tell you to use ice after flare-ups. This information is not intended to replace advice given to you by your  health care provider. Make sure you discuss any questions you have with your health care provider. Document Revised: 01/29/2019 Document Reviewed: 01/29/2019 Elsevier Patient Education  2021 [employer].

## 2020-02-17 NOTE — Progress Notes (Unsigned)
   Subjective:    Patient ID: Kaitlyn Muto, male    DOB: [birth year], 60 y.o.   MRN: [id number]  HPI  85 y male previously seen in clinic  For htn, osa, lt ear hearing loss  diastassis recti, lt thumb trigger finger Pre-diabetes, works with Urology for hypogonadism testosterone rx, with subsequent elevated psa Presents with back pain he reports has been present for 3-71yr but has not been seen in the clinic Until winter2021 rx with muscle relaxer, nsaids. Later had been somewhat effective until this visit No xrays finding in file     Review of Systems Noncontributory to current hpi, except he has been placed on testosterone But not spoken to any one about hx back pain    Objective:   Physical Exam  Wt 268 located as large abd girth Focused exam Lt psis point tenderness radiating inf. And upward quadratus lumb. Nl rom, strength b/l without radicular sensory change, mild antalgic gain Neg slr sign, flexion abd/add hip nl  Hip extension uncomfortable denies shooting or stabbing back pain        Assessment & Plan:  Low Back Pain  Extends to area of Lt hip  Discussed ordering lumbar film with lt hip   Add soma compd, lido capsaicin patch to nsaid he has been  taking q6 prn.  Xray pending for evaluation  Work restriction- light duty

## 2020-02-18 ENCOUNTER — Ambulatory Visit
Admission: RE | Admit: 2020-02-18 | Discharge: 2020-02-18 | Disposition: A | Discharge status: Home / Self Care | Payer: 59 | Source: Ambulatory Visit | Attending: Adult Medicine | Admitting: Adult Medicine

## 2020-02-18 ENCOUNTER — Other Ambulatory Visit: Payer: Self-pay

## 2020-02-18 DIAGNOSIS — M545 Low back pain, unspecified: Secondary | ICD-10-CM | POA: Diagnosis not present

## 2020-02-20 DIAGNOSIS — G4733 Obstructive sleep apnea (adult) (pediatric): Secondary | ICD-10-CM | POA: Diagnosis not present

## 2020-02-23 ENCOUNTER — Other Ambulatory Visit: Payer: Self-pay | Admitting: Physician Assistant

## 2020-02-23 NOTE — Progress Notes (Signed)
Consult

## 2020-02-23 NOTE — Progress Notes (Signed)
   Subjective:Back pain    Patient ID: Paul Pitts, male    DOB: 1960/05/02, 60 y.o.   MRN: [id number]  HPI Call patient to discuss lumbar spine x-ray   Review of Systems     deferred Objective:   Physical Exam Deferred       Assessment & Plan: Radicular back pain  Consult to [facility] for definited evaluation and Treatment.

## 2020-03-01 ENCOUNTER — Ambulatory Visit (INDEPENDENT_AMBULATORY_CARE_PROVIDER_SITE_OTHER): Payer: 59 | Admitting: Internal Medicine

## 2020-03-01 DIAGNOSIS — Z6839 Body mass index (BMI) 39.0-39.9, adult: Secondary | ICD-10-CM

## 2020-03-01 DIAGNOSIS — G4733 Obstructive sleep apnea (adult) (pediatric): Secondary | ICD-10-CM

## 2020-03-01 DIAGNOSIS — Z7189 Other specified counseling: Secondary | ICD-10-CM | POA: Diagnosis not present

## 2020-03-01 DIAGNOSIS — I10 Essential (primary) hypertension: Secondary | ICD-10-CM

## 2020-03-01 NOTE — Patient Instructions (Signed)

## 2020-03-01 NOTE — Progress Notes (Signed)
[facility] [address] [place], [place] [address]  Pulmonary Sleep Medicine   Office Visit Note  Patient Name: Paul Pitts DOB: 01-20-1960 MRN [id number]    Chief Complaint: Obstructive Sleep Apnea visit  Brief History:  Paul Pitts is seen today for initial consultation. The patient has a 3 month history of sleep apnea. 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  He used to sleep restlessly.  Patient is using PAP nightly.  The patient feels somewhat better after sleeping with PAP.  The patient reports some benefit from PAP use. Reported sleepiness is  The same and the Epworth Sleepiness Score is 5 out of 24. The patient does not take naps. The patient complains of the following: stuffy nose.  The compliance download shows very good compliance with an average use time of 5.3 hours. The AHI is 3.7  The patient does not complain of limb movements disrupting sleep.  ROS  General: (-) fever, (-) chills, (-) night sweat Nose and Sinuses: (-) nasal stuffiness or itchiness, (-) postnasal drip, (-) nosebleeds, (-) sinus trouble. Mouth and Throat: (-) sore throat, (-) hoarseness. Neck: (-) swollen glands, (-) enlarged thyroid, (-) neck pain. Respiratory: - cough, - shortness of breath, - wheezing. Neurologic: + numbness, + tingling. Psychiatric: - anxiety, - depression   Current Medication: Outpatient Encounter Medications as of 03/01/2020  Medication Sig  . carisoprodol (SOMA) 350 MG tablet Take 1 tablet (350 mg total) by mouth 4 (four) times daily as needed for muscle spasms.  . hydrochlorothiazide (HYDRODIURIL) 50 MG tablet Take 1 tablet (50 mg total) by mouth daily.  . Lido-Capsaicin-Men-Methyl Sal 0.5-0.035-5-20 % PTCH Apply 1 patch topically in the morning and at bedtime.  Marland Kitchen losartan (COZAAR) 25 MG tablet Take 1 tablet (25 mg total) by mouth daily.  . Testosterone 20.25 MG/ACT (1.62%) GEL APP 3 PUMPS QAM ALTERNATING WITH 2 PUMPS QAM   No  facility-administered encounter medications on file as of 03/01/2020.    Surgical History: History reviewed. No pertinent surgical history.  Medical History: History reviewed. No pertinent past medical history.  Family History: Non contributory to the present illness  Social History: Social History   Socioeconomic History  . Marital status: Married    Spouse name: Not on file  . Number of children: Not on file  . Years of education: Not on file  . Highest education level: Not on file  Occupational History  . Not on file  Tobacco Use  . Smoking status: Never Smoker  . Smokeless tobacco: Never Used  Substance and Sexual Activity  . Alcohol use: Yes  . Drug use: Not on file  . Sexual activity: Not on file  Other Topics Concern  . Not on file  Social History Narrative  . Not on file   Social Determinants of Health   Financial Resource Strain: Not on file  Food Insecurity: Not on file  Transportation Needs: Not on file  Physical Activity: Not on file  Stress: Not on file  Social Connections: Not on file  Intimate Partner Violence: Not on file    Vital Signs: Blood pressure (!) 155/82, pulse 68, temperature 98.6 F (37 C), temperature source Temporal, resp. rate 16, height 5\' 9"  (1.753 m), weight 265 lb (120.2 kg), SpO2 97 %.  Examination: General Appearance: The patient is well-developed, well-nourished, and in no distress. Neck Circumference: 49 Skin: Gross inspection of skin unremarkable. Head: normocephalic, no gross deformities. Eyes: no gross deformities  noted. ENT: ears appear grossly normal Neurologic: Alert and oriented. No involuntary movements.    EPWORTH SLEEPINESS SCALE:  Scale:  (0)= no chance of dozing; (1)= slight chance of dozing; (2)= moderate chance of dozing; (3)= high chance of dozing  Chance  Situtation    Sitting and reading: 1    Watching TV: 2    Sitting Inactive in public: 0    As a passenger in car: 0      Lying down to  rest: 1    Sitting and talking: 0    Sitting quielty after lunch: 1    In a car, stopped in traffic: 0   TOTAL SCORE:   5 out of 24    SLEEP STUDIES:  1. HST 12/04/19 REI 31 SpO53min 74%   CPAP COMPLIANCE DATA:  Date Range: 01/27/20-02/25/20  Average Daily Use: 5.3 hours  Median Use: 5.4  Compliance for > 4 Hours: 83%  Mask Leak: 27.3  95th Percentile Pressure: 14         LABS: No results found for this or any previous visit (from the past 2160 hour(s)).  Radiology: DG Lumbar Spine 2-3 Views  Result Date: 02/19/2020 CLINICAL DATA:  Left-sided low back pain radiating to left hip 1 year. Symptoms worse over the past 2 weeks. EXAM: LUMBAR SPINE - 2-3 VIEW COMPARISON:  None. FINDINGS: Subtle curvature of the lumbar spine convex right. There is moderate spondylosis of the lumbar spine to include moderate facet arthropathy over the lower lumbar spine. Vertebral body heights are maintained. Mild multilevel disc space narrowing throughout the lumbar spine. No compression fracture or significant spondylolisthesis. Degenerative changes of the sacroiliac joints. IMPRESSION: 1. No acute findings. 2. 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 Electronically Signed   By: Elberta Fortis M.D.   On: 02/19/2020 15:19    No results found.  DG Lumbar Spine 2-3 Views  Result Date: 02/19/2020 CLINICAL DATA:  Left-sided low back pain radiating to left hip 1 year. Symptoms worse over the past 2 weeks. EXAM: LUMBAR SPINE - 2-3 VIEW COMPARISON:  None. FINDINGS: Subtle curvature of the lumbar spine convex right. There is moderate spondylosis of the lumbar spine to include moderate facet arthropathy over the lower lumbar spine. Vertebral body heights are maintained. Mild multilevel disc space narrowing throughout the lumbar spine. No compression fracture or significant spondylolisthesis. Degenerative changes of the sacroiliac joints. IMPRESSION: 1. No acute  findings. 2. 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 Electronically Signed   By: Elberta Fortis M.D.   On: 02/19/2020 15:19      Assessment and Plan: Patient Active Problem List   Diagnosis Date Noted  . Class 2 severe obesity due to excess calories with serious comorbidity in adult (HCC) 02/17/2020  . Hypogonadism in male 09/24/2018  . HTN (hypertension) 09/24/2018  . Increased prostate specific antigen (PSA) velocity 09/24/2018  . IFG (impaired fasting glucose) 09/24/2018  . Obesity 09/24/2018  . Left ear hearing loss 09/24/2018  . Allergic rhinitis 09/24/2018  . Encounter for Department of Transportation (DOT) examination for driving license renewal 46/65/9935  . History of colonic polyps 09/24/2018      The patient does tolerate PAP and reports some benefit from PAP use. The patient is caring for his machine as recommmended and advised to increase time in bed. He is sleeping in his recliner for several hours and he was advised to set a timer in the living room to  prevent sleeping.. The patient was also counselled on the importance of weight loss.. The compliance is very good. The apnea is controlled.   1. OSA (obstructive sleep apnea) continue excellent compliance. Increase time in bed.  2. CPAP use counseling CPAP couseling-Discussed importance of adequate CPAP use as well as proper care and cleaning techniques of machine and all supplies.  3. Primary hypertension Elevated in office. Continue to monitor and f/u with PCP.  4. Class 2 severe obesity due to excess calories with serious comorbidity and body mass index (BMI) of 39.0 to 39.9 in adult [facility]) Obesity Counseling: Had a lengthy discussion regarding patients BMI and weight issues. Patient was instructed on portion control as well as increased activity. Also discussed caloric restrictions with trying to maintain intake less than 2000 Kcal. Discussions were made in accordance with the 5As  of weight management. Simple actions such as not eating late and if able to, taking a walk is suggested.   General Counseling: I have discussed the findings of the evaluation and examination with Paul Hua.  I have also discussed any further diagnostic evaluation thatmay be needed or ordered today. Paul Hua verbalizes understanding of the findings of todays visit. We also reviewed his medications today and discussed drug interactions and side effects including but not limited excessive drowsiness and altered mental states. We also discussed that there is always a risk not just to him but also people around him. he has been encouraged to call the office with any questions or concerns that should arise related to todays visit.  No orders of the defined types were placed in this encounter.       I have personally obtained a history, examined the patient, evaluated laboratory and imaging results, formulated the assessment and plan and placed orders.  This patient was seen by Lynn Ito, PA-C in collaboration with Dr. Freda Munro as a part of collaborative care agreement.   Valentino Hue Sol Blazing, PhD, FAASM  Diplomate, American Board of Sleep Medicine    Yevonne Pax, MD [facility] Diplomate ABMS Pulmonary and Critical Care Medicine Sleep medicine

## 2020-03-08 DIAGNOSIS — M545 Low back pain, unspecified: Secondary | ICD-10-CM | POA: Diagnosis not present

## 2020-03-19 DIAGNOSIS — G4733 Obstructive sleep apnea (adult) (pediatric): Secondary | ICD-10-CM | POA: Diagnosis not present

## 2020-03-24 DIAGNOSIS — M545 Low back pain, unspecified: Secondary | ICD-10-CM | POA: Diagnosis not present

## 2020-03-30 DIAGNOSIS — M545 Low back pain, unspecified: Secondary | ICD-10-CM | POA: Diagnosis not present

## 2020-03-30 DIAGNOSIS — G4733 Obstructive sleep apnea (adult) (pediatric): Secondary | ICD-10-CM | POA: Diagnosis not present

## 2020-04-08 DIAGNOSIS — M545 Low back pain, unspecified: Secondary | ICD-10-CM | POA: Diagnosis not present

## 2020-04-15 DIAGNOSIS — M545 Low back pain, unspecified: Secondary | ICD-10-CM | POA: Diagnosis not present

## 2020-04-19 DIAGNOSIS — G4733 Obstructive sleep apnea (adult) (pediatric): Secondary | ICD-10-CM | POA: Diagnosis not present

## 2020-04-21 DIAGNOSIS — M545 Low back pain, unspecified: Secondary | ICD-10-CM | POA: Diagnosis not present

## 2020-04-26 DIAGNOSIS — N401 Enlarged prostate with lower urinary tract symptoms: Secondary | ICD-10-CM | POA: Diagnosis not present

## 2020-04-26 DIAGNOSIS — R972 Elevated prostate specific antigen [PSA]: Secondary | ICD-10-CM | POA: Diagnosis not present

## 2020-04-26 DIAGNOSIS — E291 Testicular hypofunction: Secondary | ICD-10-CM | POA: Diagnosis not present

## 2020-04-26 DIAGNOSIS — N5201 Erectile dysfunction due to arterial insufficiency: Secondary | ICD-10-CM | POA: Diagnosis not present

## 2020-05-10 DIAGNOSIS — M545 Low back pain, unspecified: Secondary | ICD-10-CM | POA: Diagnosis not present

## 2020-05-13 DIAGNOSIS — M545 Low back pain, unspecified: Secondary | ICD-10-CM | POA: Diagnosis not present

## 2020-05-13 DIAGNOSIS — Z01818 Encounter for other preprocedural examination: Secondary | ICD-10-CM | POA: Diagnosis not present

## 2020-05-19 DIAGNOSIS — G4733 Obstructive sleep apnea (adult) (pediatric): Secondary | ICD-10-CM | POA: Diagnosis not present

## 2020-05-25 DIAGNOSIS — E291 Testicular hypofunction: Secondary | ICD-10-CM | POA: Diagnosis not present

## 2020-05-25 DIAGNOSIS — Z79899 Other long term (current) drug therapy: Secondary | ICD-10-CM | POA: Diagnosis not present

## 2020-05-25 DIAGNOSIS — D4010 Neoplasm of uncertain behavior of unspecified testis: Secondary | ICD-10-CM | POA: Diagnosis not present

## 2020-05-26 DIAGNOSIS — R972 Elevated prostate specific antigen [PSA]: Secondary | ICD-10-CM | POA: Diagnosis not present

## 2020-05-26 DIAGNOSIS — Z79899 Other long term (current) drug therapy: Secondary | ICD-10-CM | POA: Diagnosis not present

## 2020-05-26 DIAGNOSIS — E291 Testicular hypofunction: Secondary | ICD-10-CM | POA: Diagnosis not present

## 2020-06-09 DIAGNOSIS — M5416 Radiculopathy, lumbar region: Secondary | ICD-10-CM | POA: Diagnosis not present

## 2020-06-19 DIAGNOSIS — G4733 Obstructive sleep apnea (adult) (pediatric): Secondary | ICD-10-CM | POA: Diagnosis not present

## 2020-06-26 NOTE — Progress Notes (Signed)
[facility] [address] [place], [place] [address]  Pulmonary Sleep Medicine   Office Visit Note  Patient Name: Paul Pitts DOB: February 23, 1960 MRN [id number]    Chief Complaint: Obstructive Sleep Apnea visit  Brief History:  Paul Pitts is seen today for 4 month follow-up consult on previous initial evaluation of APAP therapy @ 5 - 20 cmH2O.  The patient has a 7 month history of sleep apnea and prior to therapy, patient had a history of snoring & restless sleep.   Patient mostly using PAP nightly.  The patient feels better after sleeping with PAP and reports benefiting from PAP use.  Epworth Sleepiness Score is 2 out of 24. The patient never take naps. The patient complains of the following: chronic back pain  The compliance download shows an average use time of 4:56 hours @ 84%. With full face mask. The AHI is 2.1  The patient does not complain of limb movements disrupting sleep.  ROS  General: (-) fever, (-) chills, (-) night sweat Nose and Sinuses: (-) nasal stuffiness or itchiness, (-) postnasal drip, (-) nosebleeds, (-) sinus trouble. Mouth and Throat: (-) sore throat, (-) hoarseness. Neck: (-) swollen glands, (-) enlarged thyroid, (-) neck pain. Respiratory: - cough, - shortness of breath, - wheezing. Neurologic: - numbness, - tingling. Psychiatric: - anxiety, - depression   Current Medication: Outpatient Encounter Medications as of 06/28/2020  Medication Sig   dutasteride (AVODART) 0.5 MG capsule Take 0.5 mg by mouth daily.   hydrochlorothiazide (HYDRODIURIL) 50 MG tablet Take 1 tablet (50 mg total) by mouth daily.   Lido-Capsaicin-Men-Methyl Sal 0.5-0.035-5-20 % PTCH Apply 1 patch topically in the morning and at bedtime.   losartan (COZAAR) 25 MG tablet Take 1 tablet (25 mg total) by mouth daily.   meloxicam (MOBIC) 7.5 MG tablet Take 7.5 mg by mouth daily.   Testosterone 20.25 MG/ACT (1.62%) GEL APP 3 PUMPS QAM ALTERNATING WITH 2 PUMPS QAM   [DISCONTINUED]  carisoprodol (SOMA) 350 MG tablet Take 1 tablet (350 mg total) by mouth 4 (four) times daily as needed for muscle spasms.   No facility-administered encounter medications on file as of 06/28/2020.    Surgical History: History reviewed. No pertinent surgical history.  Medical History: History reviewed. No pertinent past medical history.  Family History: Non contributory to the present illness  Social History: Social History   Socioeconomic History   Marital status: Married    Spouse name: Not on file   Number of children: Not on file   Years of education: Not on file   Highest education level: Not on file  Occupational History   Not on file  Tobacco Use   Smoking status: Never   Smokeless tobacco: Never  Substance and Sexual Activity   Alcohol use: Yes   Drug use: Not on file   Sexual activity: Not on file  Other Topics Concern   Not on file  Social History Narrative   Not on file   Social Determinants of Health   Financial Resource Strain: Not on file  Food Insecurity: Not on file  Transportation Needs: Not on file  Physical Activity: Not on file  Stress: Not on file  Social Connections: Not on file  Intimate Partner Violence: Not on file    Vital Signs: Blood pressure (!) 160/89, pulse 66, resp. rate 18, height 5\' 8"  (1.727 m), weight 270 lb 14.4 oz (122.9 kg), SpO2 98 %.  Examination: General Appearance: The patient is well-developed, well-nourished, and in no distress.  Neck Circumference: 49 Skin: Gross inspection of skin unremarkable. Head: normocephalic, no gross deformities. Eyes: no gross deformities noted. ENT: ears appear grossly normal Neurologic: Alert and oriented. No involuntary movements.    EPWORTH SLEEPINESS SCALE:  Scale:  (0)= no chance of dozing; (1)= slight chance of dozing; (2)= moderate chance of dozing; (3)= high chance of dozing  Chance  Situtation    Sitting and reading: 1    Watching TV: 1    Sitting Inactive in public:  0    As a passenger in car: 0      Lying down to rest: 0    Sitting and talking: 0    Sitting quielty after lunch: 1    In a car, stopped in traffic: 0   TOTAL SCORE:   2 out of 24    SLEEP STUDIES:  HST 12/04/19 - RDI 30.6,  Low SpO2 74%   CPAP COMPLIANCE DATA:  Date Range: 02/26/20 - 06/24/20  Average Daily Use: 4:56 hours  Median Use: 5:05 hours  Compliance for > 4 Hours: 84%  AHI: 2.1 respiratory events per hour  Days Used: 113/120 days  Mask Leak: 22.9 Lpm  95th Percentile Pressure: 15.6 Lpm         LABS: No results found for this or any previous visit (from the past 2160 hour(s)).  Radiology: DG Lumbar Spine 2-3 Views  Result Date: 02/19/2020 CLINICAL DATA:  Left-sided low back pain radiating to left hip 1 year. Symptoms worse over the past 2 weeks. EXAM: LUMBAR SPINE - 2-3 VIEW COMPARISON:  None. FINDINGS: Subtle curvature of the lumbar spine convex right. There is moderate spondylosis of the lumbar spine to include moderate facet arthropathy over the lower lumbar spine. Vertebral body heights are maintained. Mild multilevel disc space narrowing throughout the lumbar spine. No compression fracture or significant spondylolisthesis. Degenerative changes of the sacroiliac joints. IMPRESSION: 1. No acute findings. 2. 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 Electronically Signed   By: Elberta Fortis M.D.   On: 02/19/2020 15:19    No results found.  No results found.    Assessment and Plan: Patient Active Problem List   Diagnosis Date Noted   OSA on CPAP 06/28/2020   CPAP use counseling 06/28/2020   Morbid obesity (HCC) 06/28/2020   Class 2 severe obesity due to excess calories with serious comorbidity in adult [facility]) 02/17/2020   Hypogonadism in male 09/24/2018   HTN (hypertension) 09/24/2018   Increased prostate specific antigen (PSA) velocity 09/24/2018   IFG (impaired fasting glucose) 09/24/2018   Obesity  09/24/2018   Left ear hearing loss 09/24/2018   Allergic rhinitis 09/24/2018   Encounter for Department of Transportation (DOT) examination for driving license renewal 55/73/2202   History of colonic polyps 09/24/2018   1. OSA on CPAP The patient does tolerate PAP and reports  benefit from PAP use. He is compliant but does fall asleep in the recliner and as a result only gets about 5 hours of use nightly. Suggested he set an alarm to wake up and move to the bed and use the cpap. The patient was reminded how to clean equipment  and advised to replace supplies routinely . The patient was also counselled on weight loss . The compliance is very good . The AHI is 2.1.   OSA- continue very good compliance- increase time in bed. F/u in one year.    2. CPAP use counseling CPAP Counseling: had a lengthy discussion with  the patient regarding the importance of PAP therapy in management of the sleep apnea. Patient appears to understand the risk factor reduction and also understands the risks associated with untreated sleep apnea. Patient will try to make a good faith effort to remain compliant with therapy. Also instructed the patient on proper cleaning of the device including the water must be changed daily if possible and use of distilled water is preferred. Patient understands that the machine should be regularly cleaned with appropriate recommended cleaning solutions that do not damage the PAP machine for example given white vinegar and water rinses. Other methods such as ozone treatment may not be as good as these simple methods to achieve cleaning.   3. Morbid obesity (HCC) Obesity Counseling: Had a lengthy discussion regarding patients BMI and weight issues. Patient was instructed on portion control as well as increased activity. Also discussed caloric restrictions with trying to maintain intake less than 2000 Kcal. Discussions were made in accordance with the 5As of weight management. Simple actions  such as not eating late and if able to, taking a walk is suggested.   4. Primary hypertension Hypertension Counseling:   The following hypertensive lifestyle modification were recommended and discussed:  1. Limiting alcohol intake to less than 1 oz/Lobue of ethanol:(24 oz of beer or 8 oz of wine or 2 oz of 100-proof whiskey). 2. Take baby ASA 81 mg daily. 3. Importance of regular aerobic exercise and losing weight. 4. Reduce dietary saturated fat and cholesterol intake for overall cardiovascular health. 5. Maintaining adequate dietary potassium, calcium, and magnesium intake. 6. Regular monitoring of the blood pressure. 7. Reduce sodium intake to less than 100 mmol/[place] (less than 2.3 gm of sodium or less than 6 gm of sodium choride)      General Counseling: I have discussed the findings of the evaluation and examination with Paul Hua.  I have also discussed any further diagnostic evaluation thatmay be needed or ordered today. Paul Hua verbalizes understanding of the findings of todays visit. We also reviewed his medications today and discussed drug interactions and side effects including but not limited excessive drowsiness and altered mental states. We also discussed that there is always a risk not just to him but also people around him. he has been encouraged to call the office with any questions or concerns that should arise related to todays visit.  No orders of the defined types were placed in this encounter.       I have personally obtained a history, examined the patient, evaluated laboratory and imaging results, formulated the assessment and plan and placed orders.  This patient was seen today by Emmaline Kluver, PA-C in collaboration with Dr. Freda Munro.   Yevonne Pax, MD [facility] Diplomate ABMS Pulmonary and Critical Care Medicine Sleep medicine

## 2020-06-28 ENCOUNTER — Ambulatory Visit (INDEPENDENT_AMBULATORY_CARE_PROVIDER_SITE_OTHER): Payer: 59 | Admitting: Internal Medicine

## 2020-06-28 VITALS — BP 160/89 | HR 66 | Resp 18 | Ht 68.0 in | Wt 270.9 lb

## 2020-06-28 DIAGNOSIS — Z9989 Dependence on other enabling machines and devices: Secondary | ICD-10-CM

## 2020-06-28 DIAGNOSIS — G4733 Obstructive sleep apnea (adult) (pediatric): Secondary | ICD-10-CM | POA: Insufficient documentation

## 2020-06-28 DIAGNOSIS — I10 Essential (primary) hypertension: Secondary | ICD-10-CM | POA: Diagnosis not present

## 2020-06-28 DIAGNOSIS — Z7189 Other specified counseling: Secondary | ICD-10-CM | POA: Insufficient documentation

## 2020-06-28 NOTE — Patient Instructions (Signed)

## 2020-07-13 ENCOUNTER — Telehealth: Payer: Self-pay

## 2020-07-13 NOTE — Telephone Encounter (Signed)
Spoke with pt today. York Spaniel he is see Dr. Okey Regal for back pain. He will be signing a release for them to send records.

## 2020-07-14 DIAGNOSIS — M5416 Radiculopathy, lumbar region: Secondary | ICD-10-CM | POA: Diagnosis not present

## 2020-07-19 DIAGNOSIS — G4733 Obstructive sleep apnea (adult) (pediatric): Secondary | ICD-10-CM | POA: Diagnosis not present

## 2020-08-19 DIAGNOSIS — G4733 Obstructive sleep apnea (adult) (pediatric): Secondary | ICD-10-CM | POA: Diagnosis not present

## 2020-08-31 ENCOUNTER — Encounter: Payer: Self-pay | Admitting: Physician Assistant

## 2020-08-31 ENCOUNTER — Other Ambulatory Visit: Payer: Self-pay

## 2020-08-31 ENCOUNTER — Ambulatory Visit: Payer: Self-pay | Admitting: Physician Assistant

## 2020-08-31 VITALS — BP 139/86 | HR 73 | Temp 97.7°F | Resp 14 | Ht 69.0 in | Wt 265.0 lb

## 2020-08-31 DIAGNOSIS — G8929 Other chronic pain: Secondary | ICD-10-CM

## 2020-08-31 NOTE — Progress Notes (Signed)
   Subjective: Chronic back pain    Patient ID: Paul Pitts, male    DOB: [birth year], 60 y.o.   MRN: [id number]  HPI Patient presents for follow-up of chronic back pain for 6 months.  Patient has been evaluated and treated by [facility] since February 2022.  Patient back pain is secondary to moderate to severe degenerative changes of the lumbar spine.  This was confirmed with x-rays and MRI.  No radicular component to the lumbar spine.  No bladder or bowel dysfunction.  Patient has been refractory to physical therapy, NSAIDs, and muscle relaxants.  Patient had a transforaminal injection which decreased his back pain to a 4/10.  Patient has performed light duty since February 2022 but was told last week that they have no bruits that he can perform.  Patient is scheduled for a nerve block middle of September 2022.  Patient is hoping that this will allow him to return back to normal duties.   Review of Systems Hypertension, hypogonadism, and sleep apnea.    Objective:   Physical Exam No acute distress.  Temperature 97.7, pulse 73, respiration 14, BP is 139/86, patient is 96% O2 sat on room air.  Patient weighs 265 pounds and BMI is 39.1. Patient is walking for normal gait.  No obvious deformity of the lumbar spine.  No guarding with palpation of lumbar spine.  Patient has full Nikkel range of motion of the hip and knees.  Patient has negative straight leg test in supine position.  DTRs 2+ without clonus       Assessment & Plan: Chronic low back pain  Patient return back to light duty.  We have asked [facility] to prepare a list of limitations for job accommodation.  Patient advised to follow-up monthly pending resolution of his back pain.

## 2020-08-31 NOTE — Progress Notes (Signed)
Pt states he's scheduled for a nerve block September 19th.

## 2020-09-02 ENCOUNTER — Telehealth: Payer: Self-pay

## 2020-09-02 DIAGNOSIS — Z7689 Persons encountering health services in other specified circumstances: Secondary | ICD-10-CM

## 2020-09-02 NOTE — Telephone Encounter (Signed)
Returning patient to [facility] for Return to Work Evaluation.    Janace Aris, MD Sibyl Parr, MD [place], PT  AMD

## 2020-09-19 DIAGNOSIS — G4733 Obstructive sleep apnea (adult) (pediatric): Secondary | ICD-10-CM | POA: Diagnosis not present

## 2020-09-20 DIAGNOSIS — M545 Low back pain, unspecified: Secondary | ICD-10-CM | POA: Diagnosis not present

## 2020-09-20 DIAGNOSIS — M47817 Spondylosis without myelopathy or radiculopathy, lumbosacral region: Secondary | ICD-10-CM | POA: Diagnosis not present

## 2020-09-22 DIAGNOSIS — M47817 Spondylosis without myelopathy or radiculopathy, lumbosacral region: Secondary | ICD-10-CM | POA: Diagnosis not present

## 2020-09-27 DIAGNOSIS — H40003 Preglaucoma, unspecified, bilateral: Secondary | ICD-10-CM | POA: Diagnosis not present

## 2020-09-29 ENCOUNTER — Encounter: Payer: Self-pay | Admitting: Physician Assistant

## 2020-09-29 ENCOUNTER — Ambulatory Visit: Payer: Self-pay | Admitting: Physician Assistant

## 2020-09-29 ENCOUNTER — Other Ambulatory Visit: Payer: Self-pay

## 2020-09-29 VITALS — BP 138/80 | HR 66 | Temp 97.6°F | Resp 14 | Ht 69.0 in | Wt 260.0 lb

## 2020-09-29 DIAGNOSIS — G8929 Other chronic pain: Secondary | ICD-10-CM

## 2020-09-29 NOTE — Progress Notes (Signed)
Pt states for right now since his shot Wednesday he's been doing ok with his back./CL,RMA

## 2020-09-29 NOTE — Progress Notes (Signed)
   Subjective: Chronic back pain    Patient ID: Paul Pitts, male    DOB: Apr 09, 1960, 60 y.o.   MRN: [id number]  HPI Patient follow-up for chronic back pain.  Patient was referred to pain management through [facility].  Patient states pain management doctor recommended injections in his back.    Patient states he noticed moderate relief status post first injection given on 09/23/2020.  Patient unsure of next scheduled visit.  Patient is working modified duties.  Denies bladder or bowel dysfunction.  Patient said he is considering retiring in the near future.   Review of Systems BPH, hypogonadism, hypertension, obesity, and sleep apnea.    Objective:   Physical Exam No acute distress.  Patient has normal gait for flexed posture when ambulating.  Temperature is 97.6, pulse 66, respiration 14, BP is 130/80, patient 90% O2 sat on room air.  Patient weighs 260 pounds and BMI is 38.40. No obvious lumbar deformity.  Patient is moderate guarding palpation L4-S1.  Patient has decreased range of motion all fields.  Patient has negative straight leg test in supine position.    Assessment & Plan: Chronic back pain.   Patient will continue modified duty and follow-up with [facility] pain management department.  Advised follow-up biweekly.

## 2020-10-13 ENCOUNTER — Ambulatory Visit: Payer: Self-pay | Admitting: Physician Assistant

## 2020-10-13 ENCOUNTER — Encounter: Payer: Self-pay | Admitting: Physician Assistant

## 2020-10-13 ENCOUNTER — Other Ambulatory Visit: Payer: Self-pay

## 2020-10-13 VITALS — BP 118/70 | HR 72 | Temp 98.0°F | Resp 14 | Ht 69.0 in | Wt 260.0 lb

## 2020-10-13 DIAGNOSIS — M5442 Lumbago with sciatica, left side: Secondary | ICD-10-CM

## 2020-10-13 NOTE — Progress Notes (Signed)
Pt  states the pain is still the same;No better.Gretel Acre

## 2020-10-13 NOTE — Progress Notes (Signed)
   Subjective: Chronic back pain    Patient ID: Paul Pitts, male    DOB: 11-07-1960, 60 y.o.   MRN: [id number]  HPI Patient follow-up for chronic back pain.  Patient is now in the pain management unit of [facility].  Patient states 2 weeks ago he took a injection in his back which had only mild relief for 3 days.  Patient state he is scheduled 10/18/2020 for another injection.  Patient is a continue to have radicular component from his low back left buttocks.  Denies bladder or bowel dysfunction.   Review of Systems Hypertension, hypogonadism, sleep apnea.    Objective:   Physical Exam No acute distress.  No obvious lumbar spine deformity.  Patient has moderate guarding from L3-S1.  Patient has decreased range of motion in all fields.       Assessment & Plan: Chronic back pain with radiculopathy.   Patient will continue pain management [facility].  Follow-up in 2 weeks.

## 2020-10-18 DIAGNOSIS — M47817 Spondylosis without myelopathy or radiculopathy, lumbosacral region: Secondary | ICD-10-CM | POA: Diagnosis not present

## 2020-10-18 DIAGNOSIS — M5416 Radiculopathy, lumbar region: Secondary | ICD-10-CM | POA: Diagnosis not present

## 2020-10-18 DIAGNOSIS — M545 Low back pain, unspecified: Secondary | ICD-10-CM | POA: Diagnosis not present

## 2020-10-19 DIAGNOSIS — G4733 Obstructive sleep apnea (adult) (pediatric): Secondary | ICD-10-CM | POA: Diagnosis not present

## 2020-10-27 ENCOUNTER — Encounter: Payer: Self-pay | Admitting: Physician Assistant

## 2020-10-27 ENCOUNTER — Other Ambulatory Visit: Payer: Self-pay

## 2020-10-27 ENCOUNTER — Ambulatory Visit: Payer: Self-pay | Admitting: Physician Assistant

## 2020-10-27 VITALS — BP 141/76 | HR 62 | Temp 97.3°F | Resp 14 | Wt 267.0 lb

## 2020-10-27 DIAGNOSIS — M5442 Lumbago with sciatica, left side: Secondary | ICD-10-CM

## 2020-10-27 NOTE — Progress Notes (Signed)
   Subjective: Chronic back pain    Patient ID: Paul Pitts, male    DOB: 07/18/1960, 60 y.o.   MRN: [id number]  HPI Patient follow-up for chronic back pain.  Patient is being followed by orthopedics at [facility].  This is patient 2-week follow-up.  Patient state he received only mild relief TFESI.  It was decided that his next injection will be at a lower level than previous injection.  He is scheduled for this injection on 11/10/2020.  Patient is also scheduled for follow-up with orthopedic doctor 4 weeks after injection for reevaluation.  If still refractory will be considered surgical case.  Patient also will restart physical therapy.  Patient states continues to work with limitations at his job.  Patient denies bladder or bowel dysfunction.  Continues to have mild radicular component.   Review of Systems Chronic back pain, hypogonadism, obesity, and sleep apnea,    Objective:   Physical Exam No acute distress.  Examination of the back reveals no obvious deformity.  Patient does sit and stand with reliance on upper extremities.  Patient has moderate guarding with palpation of L3-S1.  Patient has decreased range of motion is all fields.  Patient has negative straight leg test in supine position.  DTRs are 2+ without clonus.       Assessment & Plan: Chronic back pain.   Patient return back to work with restrictions of no lifting greater than 20 pounds.  Patient will follow-up with [facility] with PT sessions and pending TFESI on 11/20/2020.  Patient will continue to follow-up every 2 weeks.

## 2020-10-27 NOTE — Progress Notes (Signed)
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

## 2020-11-10 DIAGNOSIS — M5416 Radiculopathy, lumbar region: Secondary | ICD-10-CM | POA: Diagnosis not present

## 2020-11-22 DIAGNOSIS — Z79899 Other long term (current) drug therapy: Secondary | ICD-10-CM | POA: Diagnosis not present

## 2020-11-22 DIAGNOSIS — R972 Elevated prostate specific antigen [PSA]: Secondary | ICD-10-CM | POA: Diagnosis not present

## 2020-11-22 DIAGNOSIS — E291 Testicular hypofunction: Secondary | ICD-10-CM | POA: Diagnosis not present

## 2020-11-24 DIAGNOSIS — Z79899 Other long term (current) drug therapy: Secondary | ICD-10-CM | POA: Diagnosis not present

## 2020-11-24 DIAGNOSIS — N401 Enlarged prostate with lower urinary tract symptoms: Secondary | ICD-10-CM | POA: Diagnosis not present

## 2020-11-24 DIAGNOSIS — E291 Testicular hypofunction: Secondary | ICD-10-CM | POA: Diagnosis not present

## 2020-11-29 DIAGNOSIS — M47817 Spondylosis without myelopathy or radiculopathy, lumbosacral region: Secondary | ICD-10-CM | POA: Diagnosis not present

## 2020-11-29 DIAGNOSIS — M545 Low back pain, unspecified: Secondary | ICD-10-CM | POA: Diagnosis not present

## 2020-11-29 DIAGNOSIS — M25552 Pain in left hip: Secondary | ICD-10-CM | POA: Diagnosis not present

## 2020-11-29 DIAGNOSIS — M5416 Radiculopathy, lumbar region: Secondary | ICD-10-CM | POA: Diagnosis not present

## 2021-01-31 DIAGNOSIS — M545 Low back pain, unspecified: Secondary | ICD-10-CM | POA: Diagnosis not present

## 2021-01-31 DIAGNOSIS — M5416 Radiculopathy, lumbar region: Secondary | ICD-10-CM | POA: Diagnosis not present

## 2021-01-31 DIAGNOSIS — M25552 Pain in left hip: Secondary | ICD-10-CM | POA: Diagnosis not present

## 2021-02-16 DIAGNOSIS — M5416 Radiculopathy, lumbar region: Secondary | ICD-10-CM | POA: Diagnosis not present

## 2021-03-15 DIAGNOSIS — M5416 Radiculopathy, lumbar region: Secondary | ICD-10-CM | POA: Diagnosis not present

## 2021-03-15 DIAGNOSIS — M25552 Pain in left hip: Secondary | ICD-10-CM | POA: Diagnosis not present

## 2021-03-15 DIAGNOSIS — M545 Low back pain, unspecified: Secondary | ICD-10-CM | POA: Diagnosis not present

## 2021-04-01 IMAGING — MR MR PROSTATE WO/W CM
56 series · 56 of 56 positions shown · IV contrast (10ml Gadavist)
Comparison: None.

CLINICAL DATA: No surgery NKI No biopsy No Hx prostatitis No prior
imaging Patient completed pre exam prep Rising PSA Went from 3.9 to
5.0 in 1 years time^10mL GADAVIST GADOBUTROL 1 MMOL/ML IV SOLN

EXAM:
MR PROSTATE WITHOUT AND WITH CONTRAST
TECHNIQUE: Multiplanar multisequence MRI images were obtained of the pelvis
centered about the prostate. Pre and post contrast images were
obtained.
CONTRAST:  10mL GADAVIST GADOBUTROL 1 MMOL/ML IV SOLN

[Series 3: T1 · axial · 8.0mm · 0.74mm/px · 1 of 25 slices shown (1 of 48)]
[im 1/25]
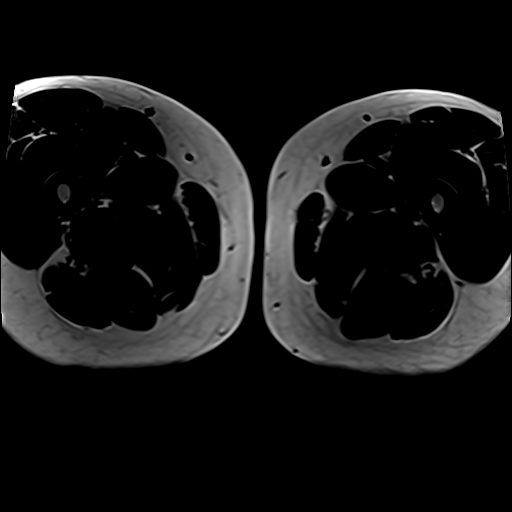

[Series 4: bSSFP fat-sat · axial · 8.0mm · 0.74mm/px · 1 of 25 slices shown]
[im 1/25]
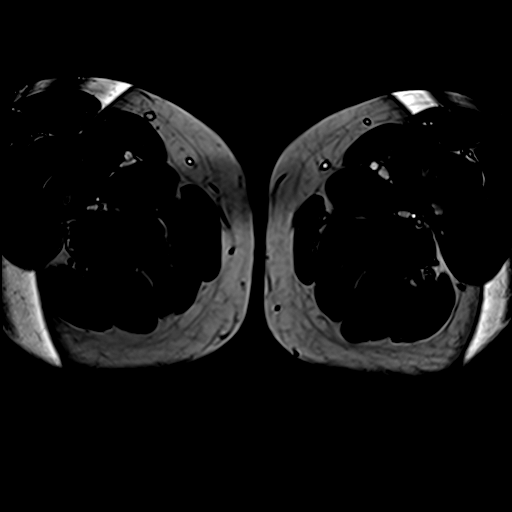

[Series 5: T2 · coronal · 3.0mm · 0.70mm/px · 1 of 30 slices shown (1 of 3)]
[im 1/30]
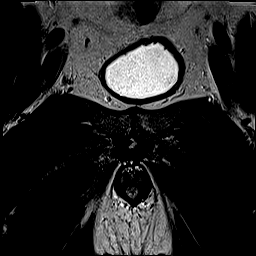

[Series 6: T2 · sagittal · 3.5mm · 0.62mm/px · 1 of 35 slices shown (2 of 3)]
[im 1/35]
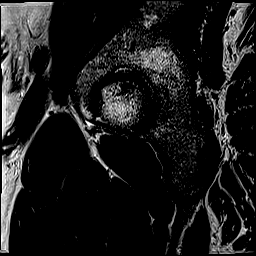

[Series 7: T1 · axial · 3.0mm · 0.35mm/px · 1 of 30 slices shown (2 of 48)]
[im 1/30]
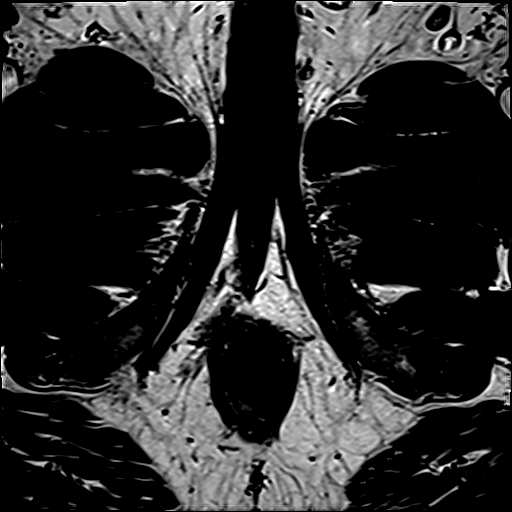

[Series 8: T2 · axial · 3.5mm · 0.56mm/px · 1 of 23 slices shown (3 of 3)]
[im 1/23]
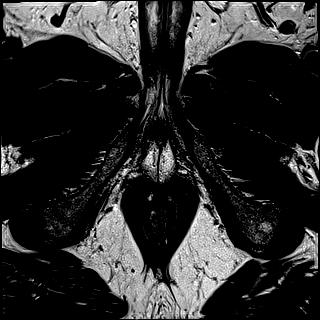

[Series 9: ax dwi_tracew · axial · 3.0mm · 0.78mm/px · 1 of 75 slices shown]
[im 1/75]
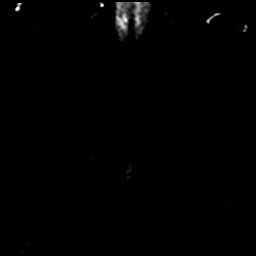

[Series 10: ax dwi_adc · axial · 3.0mm · 0.78mm/px · 1 of 25 slices shown]
[im 1/25]
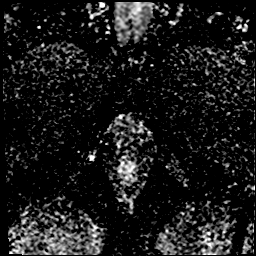

[Series 11: ax dwi_calc_bval · axial · 3.0mm · 0.78mm/px · 1 of 25 slices shown]
[im 1/25]
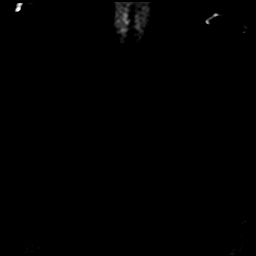

[Series 12: t2_space_tra_p2_288 · axial · 1.0mm · 1.04mm/px · 1 of 72 slices shown]
[im 1/72]
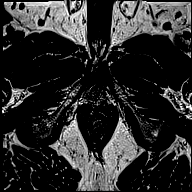

[Series 13: T1 · axial · 3.0mm · 1.15mm/px · 1 of 28 slices shown (3 of 48)]
[im 1/28]
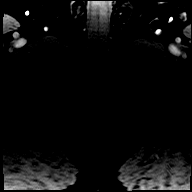

[Series 14: T1 · axial · 3.0mm · 1.15mm/px · 1 of 28 slices shown (4 of 48)]
[im 1/28]
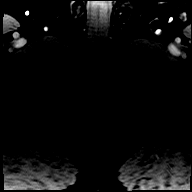

[Series 15: T1 · axial · 3.0mm · 1.15mm/px · 1 of 28 slices shown (5 of 48)]
[im 1/28]
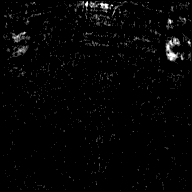

[Series 16: T1 · axial · 3.0mm · 1.15mm/px · 1 of 28 slices shown (6 of 48)]
[im 1/28]
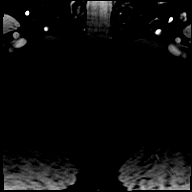

[Series 17: T1 · axial · 3.0mm · 1.15mm/px · 1 of 27 slices shown (7 of 48)]
[im 1/27]
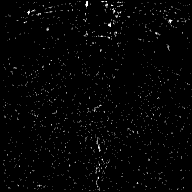

[Series 18: T1 · axial · 3.0mm · 1.15mm/px · 1 of 28 slices shown (8 of 48)]
[im 1/28]
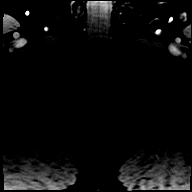

[Series 19: T1 · axial · 3.0mm · 1.15mm/px · 1 of 28 slices shown (9 of 48)]
[im 1/28]
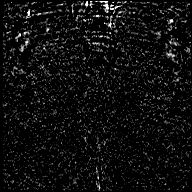

[Series 20: T1 · axial · 3.0mm · 1.15mm/px · 1 of 28 slices shown (10 of 48)]
[im 1/28]
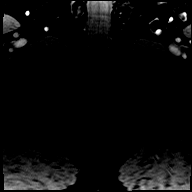

[Series 21: T1 · axial · 3.0mm · 1.15mm/px · 1 of 28 slices shown (11 of 48)]
[im 1/28]
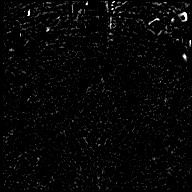

[Series 22: T1 · axial · 3.0mm · 1.15mm/px · 1 of 28 slices shown (12 of 48)]
[im 1/28]
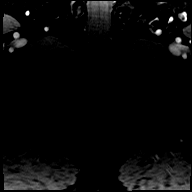

[Series 23: T1 · axial · 3.0mm · 1.15mm/px · 1 of 28 slices shown (13 of 48)]
[im 1/28]
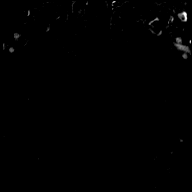

[Series 24: T1 · axial · 3.0mm · 1.15mm/px · 1 of 28 slices shown (14 of 48)]
[im 1/28]
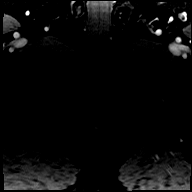

[Series 25: T1 · axial · 3.0mm · 1.15mm/px · 1 of 28 slices shown (15 of 48)]
[im 1/28]
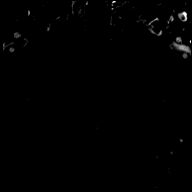

[Series 26: T1 · axial · 3.0mm · 1.15mm/px · 1 of 28 slices shown (16 of 48)]
[im 1/28]
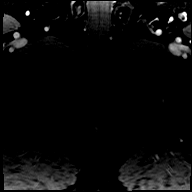

[Series 27: T1 · axial · 3.0mm · 1.15mm/px · 1 of 28 slices shown (17 of 48)]
[im 1/28]
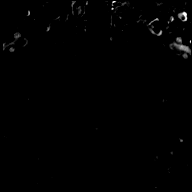

[Series 28: T1 · axial · 3.0mm · 1.15mm/px · 1 of 28 slices shown (18 of 48)]
[im 1/28]
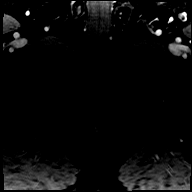

[Series 29: T1 · axial · 3.0mm · 1.15mm/px · 1 of 28 slices shown (19 of 48)]
[im 1/28]
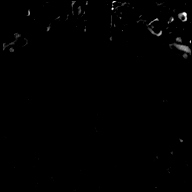

[Series 30: T1 · axial · 3.0mm · 1.15mm/px · 1 of 28 slices shown (20 of 48)]
[im 1/28]
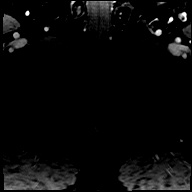

[Series 31: T1 · axial · 3.0mm · 1.15mm/px · 1 of 28 slices shown (21 of 48)]
[im 1/28]
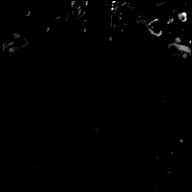

[Series 32: T1 · axial · 3.0mm · 1.15mm/px · 1 of 28 slices shown (22 of 48)]
[im 1/28]
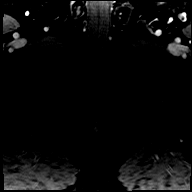

[Series 33: T1 · axial · 3.0mm · 1.15mm/px · 1 of 28 slices shown (23 of 48)]
[im 1/28]
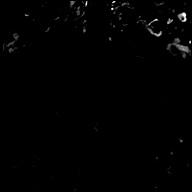

[Series 34: T1 · axial · 3.0mm · 1.15mm/px · 1 of 28 slices shown (24 of 48)]
[im 1/28]
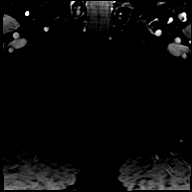

[Series 35: T1 · axial · 3.0mm · 1.15mm/px · 1 of 28 slices shown (25 of 48)]
[im 1/28]
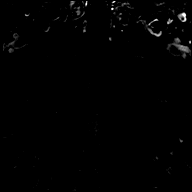

[Series 36: T1 · axial · 3.0mm · 1.15mm/px · 1 of 28 slices shown (26 of 48)]
[im 1/28]
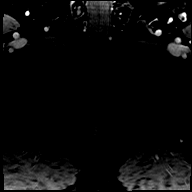

[Series 37: T1 · axial · 3.0mm · 1.15mm/px · 1 of 28 slices shown (27 of 48)]
[im 1/28]
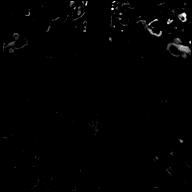

[Series 38: T1 · axial · 3.0mm · 1.15mm/px · 1 of 28 slices shown (28 of 48)]
[im 1/28]
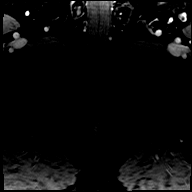

[Series 39: T1 · axial · 3.0mm · 1.15mm/px · 1 of 28 slices shown (29 of 48)]
[im 1/28]
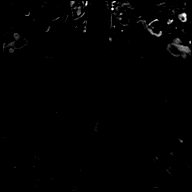

[Series 40: T1 · axial · 3.0mm · 1.15mm/px · 1 of 28 slices shown (30 of 48)]
[im 1/28]
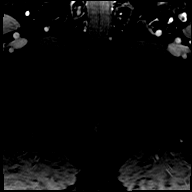

[Series 41: T1 · axial · 3.0mm · 1.15mm/px · 1 of 28 slices shown (31 of 48)]
[im 1/28]
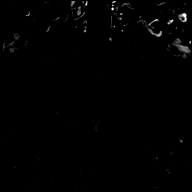

[Series 42: T1 · axial · 3.0mm · 1.15mm/px · 1 of 28 slices shown (32 of 48)]
[im 1/28]
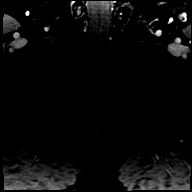

[Series 43: T1 · axial · 3.0mm · 1.15mm/px · 1 of 28 slices shown (33 of 48)]
[im 1/28]
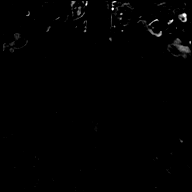

[Series 44: T1 · axial · 3.0mm · 1.15mm/px · 1 of 28 slices shown (34 of 48)]
[im 1/28]
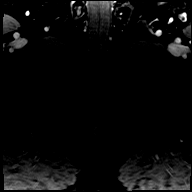

[Series 45: T1 · axial · 3.0mm · 1.15mm/px · 1 of 28 slices shown (35 of 48)]
[im 1/28]
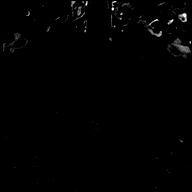

[Series 46: T1 · axial · 3.0mm · 1.15mm/px · 1 of 28 slices shown (36 of 48)]
[im 1/28]
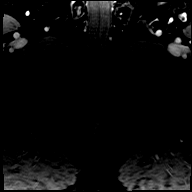

[Series 47: T1 · axial · 3.0mm · 1.15mm/px · 1 of 28 slices shown (37 of 48)]
[im 1/28]
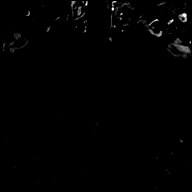

[Series 48: T1 · axial · 3.0mm · 1.15mm/px · 1 of 28 slices shown (38 of 48)]
[im 1/28]
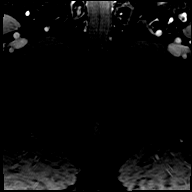

[Series 49: T1 · axial · 3.0mm · 1.15mm/px · 1 of 28 slices shown (39 of 48)]
[im 1/28]
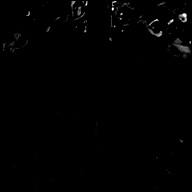

[Series 50: T1 · axial · 3.0mm · 1.15mm/px · 1 of 28 slices shown (40 of 48)]
[im 1/28]
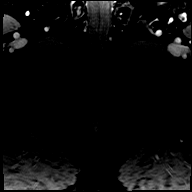

[Series 51: T1 · axial · 3.0mm · 1.15mm/px · 1 of 28 slices shown (41 of 48)]
[im 1/28]
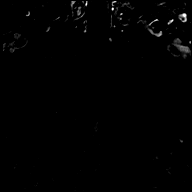

[Series 52: T1 · axial · 3.0mm · 1.15mm/px · 1 of 28 slices shown (42 of 48)]
[im 1/28]
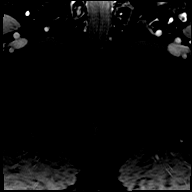

[Series 53: T1 · axial · 3.0mm · 1.15mm/px · 1 of 28 slices shown (43 of 48)]
[im 1/28]
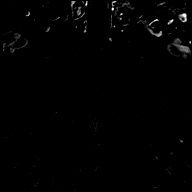

[Series 54: T1 · axial · 3.0mm · 1.15mm/px · 1 of 28 slices shown (44 of 48)]
[im 1/28]
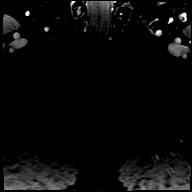

[Series 55: T1 · axial · 3.0mm · 1.15mm/px · 1 of 28 slices shown (45 of 48)]
[im 1/28]
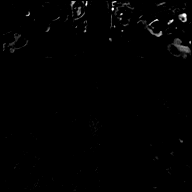

[Series 56: T1 · axial · 3.0mm · 1.15mm/px · 1 of 28 slices shown (46 of 48)]
[im 1/28]
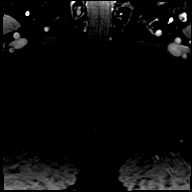

[Series 57: T1 · axial · 3.0mm · 1.15mm/px · 1 of 28 slices shown (47 of 48)]
[im 1/28]
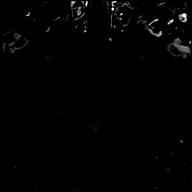

[Series 58: T1 · axial · 3.0mm · 1.15mm/px · 1 of 28 slices shown (48 of 48)]
[im 1/28]
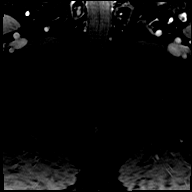

[56 of 56 positions shown; findings below may reference images not displayed]

FINDINGS: Prostate: Mild heterogeneity of signal intensity within the zone T2
weighted imaging without focality. No foci of restricted diffusion
within the peripheral zone.

The transitional zone is mildly nodular with no suspicious imaging
characteristics. There are several extruded cystic components the
prostate gland anterior and RIGHT lateral to the urethra at the
level the base and mid gland.

No abnormal enhancement.

Volume: 4.3 x 3.6 by 4.1 cm (volume = 33 cm^3)

Transcapsular spread:  Absent

Seminal vesicle involvement: Absent

Neurovascular bundle involvement: Absent

Pelvic adenopathy: Absent

Bone metastasis: Absent

Other findings: None
IMPRESSION: 1. No high-grade carcinoma in the peripheral zone ( PI-RADS: 1).
2. Minimally nodular transitional zone consistent with benign
prostate hypertrophy ( PI-RADS: 1).
3. Benign-appearing cystic change anterior and RIGHT lateral to the
urethra at the base and mid gland.

## 2021-04-18 ENCOUNTER — Other Ambulatory Visit: Payer: Self-pay

## 2021-04-18 DIAGNOSIS — Z0283 Encounter for blood-alcohol and blood-drug test: Secondary | ICD-10-CM

## 2021-04-18 NOTE — Progress Notes (Signed)
Presents to [facility] for Random DOT Drug Screen & Breath Alcohol Screen. ? ?[facility] Acct #:  [id number] ?[facility] Specimen #:  [id number] ? ?Breath Alcohol Results =  ? ?AMD ?

## 2021-05-16 ENCOUNTER — Ambulatory Visit: Payer: Self-pay

## 2021-05-16 DIAGNOSIS — Z0000 Encounter for general adult medical examination without abnormal findings: Secondary | ICD-10-CM

## 2021-05-16 DIAGNOSIS — Z79899 Other long term (current) drug therapy: Secondary | ICD-10-CM | POA: Diagnosis not present

## 2021-05-16 DIAGNOSIS — E291 Testicular hypofunction: Secondary | ICD-10-CM | POA: Diagnosis not present

## 2021-05-16 DIAGNOSIS — N401 Enlarged prostate with lower urinary tract symptoms: Secondary | ICD-10-CM | POA: Diagnosis not present

## 2021-05-16 LAB — POCT URINALYSIS DIPSTICK
Bilirubin, UA: NEGATIVE
Blood, UA: NEGATIVE
Glucose, UA: NEGATIVE
Ketones, UA: NEGATIVE
Leukocytes, UA: NEGATIVE
Nitrite, UA: NEGATIVE
Protein, UA: NEGATIVE
Spec Grav, UA: 1.025 (ref 1.010–1.025)
Urobilinogen, UA: 0.2 E.U./dL
pH, UA: 6 (ref 5.0–8.0)

## 2021-05-16 NOTE — Progress Notes (Signed)
Pt presents today for physical labs, will return to clinic for scheduled physical.  

## 2021-05-18 LAB — CMP12+LP+TP+TSH+6AC+PSA+CBC…
ALT: 23 IU/L (ref 0–44)
AST: 21 IU/L (ref 0–40)
Albumin/Globulin Ratio: 1.5 (ref 1.2–2.2)
Albumin: 4.2 g/dL (ref 3.8–4.8)
Alkaline Phosphatase: 89 IU/L (ref 44–121)
BUN/Creatinine Ratio: 10 (ref 10–24)
BUN: 9 mg/dL (ref 8–27)
Basophils Absolute: 0.1 10*3/uL (ref 0.0–0.2)
Basos: 1 %
Bilirubin Total: 0.5 mg/dL (ref 0.0–1.2)
Calcium: 9.2 mg/dL (ref 8.6–10.2)
Chloride: 100 mmol/L (ref 96–106)
Chol/HDL Ratio: 3.7 ratio (ref 0.0–5.0)
Cholesterol, Total: 153 mg/dL (ref 100–199)
Creatinine, Ser: 0.86 mg/dL (ref 0.76–1.27)
EOS (ABSOLUTE): 0.2 10*3/uL (ref 0.0–0.4)
Eos: 2 %
Estimated CHD Risk: 0.6 times avg. (ref 0.0–1.0)
Free Thyroxine Index: 2.4 (ref 1.2–4.9)
GGT: 20 IU/L (ref 0–65)
Globulin, Total: 2.8 g/dL (ref 1.5–4.5)
Glucose: 123 mg/dL — ABNORMAL HIGH (ref 70–99)
HDL: 41 mg/dL (ref >39)
Hematocrit: 45.6 % (ref 37.5–51.0)
Hemoglobin: 16.5 g/dL (ref 13.0–17.7)
Immature Grans (Abs): 0 10*3/uL (ref 0.0–0.1)
Immature Granulocytes: 0 %
Iron: 111 ug/dL (ref 38–169)
LDH: 225 IU/L — ABNORMAL HIGH (ref 121–224)
LDL Chol Calc (NIH): 99 mg/dL (ref 0–99)
Lymphocytes Absolute: 2.3 10*3/uL (ref 0.7–3.1)
Lymphs: 24 %
MCH: 30.7 pg (ref 26.6–33.0)
MCHC: 36.2 g/dL — ABNORMAL HIGH (ref 31.5–35.7)
MCV: 85 fL (ref 79–97)
Monocytes Absolute: 0.6 10*3/uL (ref 0.1–0.9)
Monocytes: 6 %
Neutrophils Absolute: 6.2 10*3/uL (ref 1.4–7.0)
Neutrophils: 67 %
Phosphorus: 3.5 mg/dL (ref 2.8–4.1)
Platelets: 194 10*3/uL (ref 150–450)
Potassium: 3.7 mmol/L (ref 3.5–5.2)
Prostate Specific Ag, Serum: 3.6 ng/mL (ref 0.0–4.0)
RBC: 5.38 x10E6/uL (ref 4.14–5.80)
RDW: 13 % (ref 11.6–15.4)
Sodium: 138 mmol/L (ref 134–144)
T3 Uptake Ratio: 28 % (ref 24–39)
T4, Total: 8.7 ug/dL (ref 4.5–12.0)
TSH: 1.55 u[IU]/mL (ref 0.450–4.500)
Total Protein: 7 g/dL (ref 6.0–8.5)
Triglycerides: 68 mg/dL (ref 0–149)
Uric Acid: 4.8 mg/dL (ref 3.8–8.4)
VLDL Cholesterol Cal: 13 mg/dL (ref 5–40)
WBC: 9.4 10*3/uL (ref 3.4–10.8)
eGFR: 99 mL/min/{1.73_m2} (ref >59)

## 2021-05-18 LAB — TESTOSTERONE,FREE AND TOTAL
Testosterone, Free: 6.6 pg/mL (ref 6.6–18.1)
Testosterone: 410 ng/dL (ref 264–916)

## 2021-05-18 LAB — HGB A1C W/O EAG: Hgb A1c MFr Bld: 7 % — ABNORMAL HIGH (ref 4.8–5.6)

## 2021-05-19 NOTE — Progress Notes (Signed)
Labs faxed to Dr. Anola Gurney of [facility] as requested by patient. Fax:  [phone]  AMD

## 2021-05-23 ENCOUNTER — Encounter: Payer: Self-pay | Admitting: Physician Assistant

## 2021-05-23 ENCOUNTER — Other Ambulatory Visit: Payer: Self-pay | Admitting: Physician Assistant

## 2021-05-23 ENCOUNTER — Ambulatory Visit: Payer: Self-pay | Admitting: Physician Assistant

## 2021-05-23 VITALS — BP 172/95 | HR 67 | Temp 98.0°F | Resp 14 | Ht 68.0 in | Wt 260.0 lb

## 2021-05-23 DIAGNOSIS — E0800 Diabetes mellitus due to underlying condition with hyperosmolarity without nonketotic hyperglycemic-hyperosmolar coma (NKHHC): Secondary | ICD-10-CM

## 2021-05-23 DIAGNOSIS — Z0000 Encounter for general adult medical examination without abnormal findings: Secondary | ICD-10-CM

## 2021-05-23 DIAGNOSIS — I10 Essential (primary) hypertension: Secondary | ICD-10-CM

## 2021-05-23 MED ORDER — LOSARTAN POTASSIUM 25 MG PO TABS: 25.0000 mg | ORAL_TABLET | Freq: Every day | ORAL | 2 refills | Status: DC

## 2021-05-23 MED ORDER — METFORMIN HCL 850 MG PO TABS: 850.0000 mg | ORAL_TABLET | Freq: Two times a day (BID) | ORAL | 3 refills | Status: DC

## 2021-05-23 MED ORDER — HYDROCHLOROTHIAZIDE 50 MG PO TABS: 50.0000 mg | ORAL_TABLET | Freq: Every day | ORAL | 2 refills | Status: DC

## 2021-05-23 NOTE — Progress Notes (Signed)
Pt presents today to complete physical, Pt denies any issues or concerns at this time/CL,RMA 

## 2021-05-23 NOTE — Progress Notes (Signed)
[facility] ____________________________________________   None    (approximate)  I have reviewed the triage vital signs and the nursing notes.   HISTORY  Chief Complaint No chief complaint on file.  { HPI Paul Pitts is a 61 y.o. male patient arrived for annual physical exam.  Patient admits to noncompliance of hypertension medication.        History reviewed. No pertinent past medical history.  Patient Active Problem List   Diagnosis Date Noted   OSA on CPAP 06/28/2020   CPAP use counseling 06/28/2020   Morbid obesity ([place]) 06/28/2020   Class 2 severe obesity due to excess calories with serious comorbidity in adult [facility]) 02/17/2020   Hypogonadism in male 09/24/2018   HTN (hypertension) 09/24/2018   Increased prostate specific antigen (PSA) velocity 09/24/2018   IFG (impaired fasting glucose) 09/24/2018   Obesity 09/24/2018   Left ear hearing loss 09/24/2018   Allergic rhinitis 09/24/2018   Encounter for Department of Transportation (DOT) examination for driving license renewal [id number]   History of colonic polyps 09/24/2018    History reviewed. No pertinent surgical history.  Prior to Admission medications   Medication Sig Start Date End Date Taking? Authorizing Provider  dutasteride (AVODART) 0.5 MG capsule Take 0.5 mg by mouth daily. 05/26/20  Yes [provider]  Testosterone 20.25 MG/ACT (1.62%) GEL APP 3 PUMPS QAM ALTERNATING WITH 2 PUMPS QAM 06/21/18  Yes [provider]  hydrochlorothiazide (HYDRODIURIL) 50 MG tablet Take 1 tablet (50 mg total) by mouth daily. 05/23/21   Sable Feil, PA-C  losartan (COZAAR) 25 MG tablet Take 1 tablet (25 mg total) by mouth daily. 05/23/21   Sable Feil, PA-C    Allergies Naproxen sodium and Tizanidine  History reviewed. No pertinent family history.  Social History Social History   Tobacco Use   Smoking status: Never   Smokeless tobacco: Never  Substance  Use Topics   Alcohol use: Yes    Review of Systems Constitutional: No fever/chills Eyes: No visual changes. ENT: No sore throat. Cardiovascular: Denies chest pain. Respiratory: Denies shortness of breath. Gastrointestinal: No abdominal pain.  No nausea, no vomiting.  No diarrhea.  No constipation. Genitourinary: Negative for dysuria.  Hypogonadism Musculoskeletal: Negative for back pain. Skin: Negative for rash. Neurological: Negative for headaches, focal weakness or numbness. Endocrine: Hypertension   ____________________________________________   PHYSICAL EXAM:  VITAL SIGNS: BP is 172/95, pulse 67, respiration 14, temperature 98, patient 97% O2 sat on room air.  Patient weighs 260 pounds and BMI is 39.53. Constitutional: Alert and oriented. Well appearing and in no acute distress. Eyes: Conjunctivae are normal. PERRL. EOMI. Head: Atraumatic. Nose: No congestion/rhinnorhea. Mouth/Throat: Mucous membranes are moist.  Oropharynx non-erythematous. Neck: No stridor.  No cervical spine tenderness to palpation. Hematological/Lymphatic/Immunilogical: No cervical lymphadenopathy. Cardiovascular: Normal rate, regular rhythm. Grossly normal heart sounds.  Good peripheral circulation. Respiratory: Normal respiratory effort.  No retractions. Lungs CTAB. Gastrointestinal: Soft and nontender.  Distention secondary to body habitus. No abdominal bruits. No CVA tenderness. Genitourinary: Deferred Musculoskeletal: No lower extremity tenderness nor edema.  No joint effusions. Neurologic:  Normal speech and language. No gross focal neurologic deficits are appreciated. No gait instability. Skin:  Skin is warm, dry and intact. No rash noted. Psychiatric: Mood and affect are normal. Speech and behavior are normal.  ____________________________________________   LABS  ____________________________________________  EKG  Sinus  Rhythm  Low voltage in limb leads.   -Inferior infarct -probably  not recent.  ABNORMAL  ____________________________________________    ____________________________________________   INITIAL IMPRESSION / ASSESSMENT AND PLAN  As part of my medical decision making, I reviewed the following data within the [place]      Discussed lab and EKG findings with patient.  Patient is amenable to restarting Cozaar and HCTZ.  Patient will start taking metformin 850 mg twice daily.  Patient will follow-up in 3 months for hemoglobin A1c.        ____________________________________________   FINAL CLINICAL IMPRESSion  On exam  ED Discharge Orders     None        Note:  This document was prepared using Dragon voice recognition software and may include unintentional dictation errors.

## 2021-06-10 ENCOUNTER — Ambulatory Visit: Payer: Self-pay

## 2021-06-10 DIAGNOSIS — Z01118 Encounter for examination of ears and hearing with other abnormal findings: Secondary | ICD-10-CM

## 2021-06-27 ENCOUNTER — Ambulatory Visit (INDEPENDENT_AMBULATORY_CARE_PROVIDER_SITE_OTHER): Payer: 59 | Admitting: Internal Medicine

## 2021-06-27 VITALS — BP 133/78 | HR 69 | Resp 16 | Ht 68.0 in | Wt 270.9 lb

## 2021-06-27 DIAGNOSIS — Z7189 Other specified counseling: Secondary | ICD-10-CM | POA: Diagnosis not present

## 2021-06-27 DIAGNOSIS — I10 Essential (primary) hypertension: Secondary | ICD-10-CM | POA: Diagnosis not present

## 2021-06-27 DIAGNOSIS — Z9989 Dependence on other enabling machines and devices: Secondary | ICD-10-CM

## 2021-06-27 DIAGNOSIS — G4733 Obstructive sleep apnea (adult) (pediatric): Secondary | ICD-10-CM | POA: Diagnosis not present

## 2021-06-27 NOTE — Progress Notes (Signed)
[facility] [place], [place] [address]  Pulmonary Sleep Medicine   Office Visit Note  Patient Name: Paul Pitts DOB: 12/31/1961 MRN [id number]    Chief Complaint: Obstructive Sleep Apnea visit  Brief History:  Quoc is seen today for a one year follow up on APAP 5-20cmh20.  The patient has a 1 year 7 month history of sleep apnea. Patient is not using PAP nightly.  The patient feels rested after sleeping with PAP.  The patient reports benefit from PAP use. Reported sleepiness is  improved and the Epworth Sleepiness Score is 5 out of 24. The patient does not take naps. The patient complains of the following: some oral dryness.  The compliance download for the year shows 45%  compliance with an average use time of 4 hours. The AHI is 1.9  The patient does not complain of limb movements disrupting sleep. Patient admits to falling asleep first in his recliner for a few hours (without CPAP) and then he wakes and goes to bed and puts CPAP on, thus explaining the 4 hour use time.   ROS  General: (-) fever, (-) chills, (-) night sweat Nose and Sinuses: (-) nasal stuffiness or itchiness, (-) postnasal drip, (-) nosebleeds, (-) sinus trouble. Mouth and Throat: (-) sore throat, (-) hoarseness. Neck: (-) swollen glands, (-) enlarged thyroid, (-) neck pain. Respiratory: + cough, - shortness of breath, - wheezing. Neurologic: - numbness, - tingling. Psychiatric: - anxiety, - depression   Current Medication: Outpatient Encounter Medications as of 06/27/2021  Medication Sig   dutasteride (AVODART) 0.5 MG capsule Take 0.5 mg by mouth daily.   hydrochlorothiazide (HYDRODIURIL) 50 MG tablet Take 1 tablet (50 mg total) by mouth daily.   hydrochlorothiazide (HYDRODIURIL) 50 MG tablet Take 1 tablet by mouth daily.   losartan (COZAAR) 25 MG tablet Take 1 tablet (25 mg total) by mouth daily.   metFORMIN (GLUCOPHAGE) 850 MG tablet Take 1 tablet (850 mg total) by mouth 2 (two) times  daily with a meal.   Testosterone 20.25 MG/ACT (1.62%) GEL APP 3 PUMPS QAM ALTERNATING WITH 2 PUMPS QAM   No facility-administered encounter medications on file as of 06/27/2021.    Surgical History: No past surgical history on file.  Medical History: No past medical history on file.  Family History: Non contributory to the present illness  Social History: Social History   Socioeconomic History   Marital status: Married    Spouse name: Not on file   Number of children: Not on file   Years of education: Not on file   Highest education level: Not on file  Occupational History   Not on file  Tobacco Use   Smoking status: Never   Smokeless tobacco: Never  Substance and Sexual Activity   Alcohol use: Yes   Drug use: Not on file   Sexual activity: Not on file  Other Topics Concern   Not on file  Social History Narrative   Not on file   Social Determinants of Health   Financial Resource Strain: Not on file  Food Insecurity: Not on file  Transportation Needs: Not on file  Physical Activity: Not on file  Stress: Not on file  Social Connections: Not on file  Intimate Partner Violence: Not on file    Vital Signs: Blood pressure 133/78, pulse 69, resp. rate 16, height 5' 8" (1.727 m), weight 270 lb 14.4 oz (122.9 kg), SpO2 96 %. Body mass index is 41.19 kg/m.    Examination:  General Appearance: The patient is well-developed, well-nourished, and in no distress. Neck Circumference: 49.5cm Skin: Gross inspection of skin unremarkable. Head: normocephalic, no gross deformities. Eyes: no gross deformities noted. ENT: ears appear grossly normal Neurologic: Alert and oriented. No involuntary movements.    EPWORTH SLEEPINESS SCALE:  Scale:  (0)= no chance of dozing; (1)= slight chance of dozing; (2)= moderate chance of dozing; (3)= high chance of dozing  Chance  Situtation    Sitting and reading: 1    Watching TV: 2    Sitting Inactive in public: 0    As a  passenger in car: 1      Lying down to rest: 1    Sitting and talking: 0    Sitting quielty after lunch: 0    In a car, stopped in traffic: 0   TOTAL SCORE:   5 out of 24    SLEEP STUDIES:  HST 12/04/19 - RDI 30.6,  Low SpO2 74%,   CPAP COMPLIANCE DATA:  Date Range: 06/24/20-06/23/21  Average Daily Use: 4 hours 14 min  Median Use: 4 hrs 8 min  Compliance for > 4 Hours: 45% days  AHI: 1.9 respiratory events per hour  Days Used: 295/365  Mask Leak: 6.3  95th Percentile Pressure: 15.7         LABS: Recent Results (from the past 2160 hour(s))  CMP12+LP+TP+TSH+6AC+PSA+CBC.     Status: Abnormal   Collection Time: 05/16/21  9:36 AM  Result Value Ref Range   Glucose 123 (H) 70 - 99 mg/dL   Uric Acid 4.8 3.8 - 8.4 mg/dL    Comment:            Therapeutic target for gout patients: <6.0   BUN 9 8 - 27 mg/dL   Creatinine, Ser 0.86 0.76 - 1.27 mg/dL   eGFR 99 >59 mL/min/1.73   BUN/Creatinine Ratio 10 10 - 24   Sodium 138 134 - 144 mmol/L   Potassium 3.7 3.5 - 5.2 mmol/L   Chloride 100 96 - 106 mmol/L   Calcium 9.2 8.6 - 10.2 mg/dL   Phosphorus 3.5 2.8 - 4.1 mg/dL   Total Protein 7.0 6.0 - 8.5 g/dL   Albumin 4.2 3.8 - 4.8 g/dL   Globulin, Total 2.8 1.5 - 4.5 g/dL   Albumin/Globulin Ratio 1.5 1.2 - 2.2   Bilirubin Total 0.5 0.0 - 1.2 mg/dL   Alkaline Phosphatase 89 44 - 121 IU/L   LDH 225 (H) 121 - 224 IU/L   AST 21 0 - 40 IU/L   ALT 23 0 - 44 IU/L   GGT 20 0 - 65 IU/L   Iron 111 38 - 169 ug/dL   Cholesterol, Total 153 100 - 199 mg/dL   Triglycerides 68 0 - 149 mg/dL   HDL 41 >39 mg/dL   VLDL Cholesterol Cal 13 5 - 40 mg/dL   LDL Chol Calc (NIH) 99 0 - 99 mg/dL   Chol/HDL Ratio 3.7 0.0 - 5.0 ratio    Comment:                                   T. Chol/HDL Ratio                                             Men  Women  1/2 Avg.Risk  3.4    3.3                                   Avg.Risk  5.0    4.4                                 2X Avg.Risk  9.6    7.1                                3X Avg.Risk 23.4   11.0    Estimated CHD Risk 0.6 0.0 - 1.0 times avg.    Comment: The CHD Risk is based on the T. Chol/HDL ratio. Other factors affect CHD Risk such as hypertension, smoking, diabetes, severe obesity, and family history of premature CHD.    TSH 1.550 0.450 - 4.500 uIU/mL   T4, Total 8.7 4.5 - 12.0 ug/dL   T3 Uptake Ratio 28 24 - 39 %   Free Thyroxine Index 2.4 1.2 - 4.9   Prostate Specific Ag, Serum 3.6 0.0 - 4.0 ng/mL    Comment: Roche ECLIA methodology. According to the American Urological Association, Serum PSA should decrease and remain at undetectable levels after radical prostatectomy. The AUA defines biochemical recurrence as an initial PSA value 0.2 ng/mL or greater followed by a subsequent confirmatory PSA value 0.2 ng/mL or greater. Values obtained with different assay methods or kits cannot be used interchangeably. Results cannot be interpreted as absolute evidence of the presence or absence of malignant disease.    WBC 9.4 3.4 - 10.8 x10E3/uL   RBC 5.38 4.14 - 5.80 x10E6/uL   Hemoglobin 16.5 13.0 - 17.7 g/dL   Hematocrit 45.6 37.5 - 51.0 %   MCV 85 79 - 97 fL   MCH 30.7 26.6 - 33.0 pg   MCHC 36.2 (H) 31.5 - 35.7 g/dL   RDW 13.0 11.6 - 15.4 %   Platelets 194 150 - 450 x10E3/uL   Neutrophils 67 Not Estab. %   Lymphs 24 Not Estab. %   Monocytes 6 Not Estab. %   Eos 2 Not Estab. %   Basos 1 Not Estab. %   Neutrophils Absolute 6.2 1.4 - 7.0 x10E3/uL   Lymphocytes Absolute 2.3 0.7 - 3.1 x10E3/uL   Monocytes Absolute 0.6 0.1 - 0.9 x10E3/uL   EOS (ABSOLUTE) 0.2 0.0 - 0.4 x10E3/uL   Basophils Absolute 0.1 0.0 - 0.2 x10E3/uL   Immature Granulocytes 0 Not Estab. %   Immature Grans (Abs) 0.0 0.0 - 0.1 x10E3/uL  Testosterone,Free and Total     Status: None   Collection Time: 05/16/21  9:36 AM  Result Value Ref Range   Testosterone 410 264 - 916 ng/dL    Comment: Adult male reference interval is  based on a population of healthy nonobese males (BMI <30) between 32 and 25 years old. [place], [place] [phone]. PMID: [id number].    Testosterone, Free 6.6 6.6 - 18.1 pg/mL  Hgb A1c w/o eAG     Status: Abnormal   Collection Time: 05/16/21  9:36 AM  Result Value Ref Range   Hgb A1c MFr Bld 7.0 (H) 4.8 - 5.6 %    Comment:          Prediabetes: 5.7 - 6.4  Diabetes: >6.4          Glycemic control for adults with diabetes: <7.0   POCT urinalysis dipstick     Status: None   Collection Time: 05/16/21 11:04 AM  Result Value Ref Range   Color, UA Dark Yellow    Clarity, UA Clear    Glucose, UA Negative Negative   Bilirubin, UA Negative    Ketones, UA Negative    Spec Grav, UA 1.025 1.010 - 1.025   Blood, UA Negative    pH, UA 6.0 5.0 - 8.0   Protein, UA Negative Negative   Urobilinogen, UA 0.2 0.2 or 1.0 E.U./dL   Nitrite, UA Negative    Leukocytes, UA Negative Negative   Appearance     Odor      Radiology: DG Lumbar Spine 2-3 Views  Result Date: 02/19/2020 CLINICAL DATA:  Left-sided low back pain radiating to left hip 1 year. Symptoms worse over the past 2 weeks. EXAM: LUMBAR SPINE - 2-3 VIEW COMPARISON:  None. FINDINGS: Subtle curvature of the lumbar spine convex right. There is moderate spondylosis of the lumbar spine to include moderate facet arthropathy over the lower lumbar spine. Vertebral body heights are maintained. Mild multilevel disc space narrowing throughout the lumbar spine. No compression fracture or significant spondylolisthesis. Degenerative changes of the sacroiliac joints. IMPRESSION: 1. No acute findings. 2. 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 Electronically Signed   By: Marin Olp M.D.   On: 02/19/2020 15:19    No results found.  No results found.    Assessment and Plan: Patient Active Problem List   Diagnosis Date Noted   OSA on CPAP 06/28/2020   CPAP use counseling 06/28/2020   Morbid  obesity ([place]) 06/28/2020   Class 2 severe obesity due to excess calories with serious comorbidity in adult [facility]) 02/17/2020   Hypogonadism in male 09/24/2018   HTN (hypertension) 09/24/2018   Increased prostate specific antigen (PSA) velocity 09/24/2018   IFG (impaired fasting glucose) 09/24/2018   Obesity 09/24/2018   Left ear hearing loss 09/24/2018   Allergic rhinitis 09/24/2018   Encounter for Department of Transportation (DOT) examination for driving license renewal 12/87/8676   History of colonic polyps 09/24/2018   1. OSA on CPAP The patient does tolerate PAP and reports  benefit from PAP use. The patient was reminded how to clean equipment and advised to replace supplies routinely. The patient was also counselled on weight loss. The compliance is fair, pt was reminded to set an alarm so he does not sleep all night in the recliner not using cpap. The AHI is 1.9.   OSA on cpap- CPAP continues to be medically necessary to treat this patient's OSA.  Increase hours of use- follow up in 1 year. Try xylimelts for dry mouth.    2. CPAP use counseling CPAP Counseling: had a lengthy discussion with the patient regarding the importance of PAP therapy in management of the sleep apnea. Patient appears to understand the risk factor reduction and also understands the risks associated with untreated sleep apnea. Patient will try to make a good faith effort to remain compliant with therapy. Also instructed the patient on proper cleaning of the device including the water must be changed daily if possible and use of distilled water is preferred. Patient understands that the machine should be regularly cleaned with appropriate recommended cleaning solutions that do not damage the PAP machine for example given white vinegar and water rinses. Other methods such  as ozone treatment may not be as good as these simple methods to achieve cleaning.   3. Morbid obesity ([place]) Obesity Counseling: Had a lengthy  discussion regarding patients BMI and weight issues. Patient was instructed on portion control as well as increased activity. Also discussed caloric restrictions with trying to maintain intake less than 2000 Kcal. Discussions were made in accordance with the 5As of weight management. Simple actions such as not eating late and if able to, taking a walk is suggested.   4. Primary hypertension Hypertension Counseling:   The following hypertensive lifestyle modification were recommended and discussed:  1. Limiting alcohol intake to less than 1 oz/[place] of ethanol:(24 oz of beer or 8 oz of wine or 2 oz of 100-proof whiskey). 2. Take baby ASA 81 mg daily. 3. Importance of regular aerobic exercise and losing weight. 4. Reduce dietary saturated fat and cholesterol intake for overall cardiovascular health. 5. Maintaining adequate dietary potassium, calcium, and magnesium intake. 6. Regular monitoring of the blood pressure. 7. Reduce sodium intake to less than 100 mmol/[place] (less than 2.3 gm of sodium or less than 6 gm of sodium choride)       General Counseling: I have discussed the findings of the evaluation and examination with Shanon Brow.  I have also discussed any further diagnostic evaluation thatmay be needed or ordered today. Shanon Brow verbalizes understanding of the findings of todays visit. We also reviewed his medications today and discussed drug interactions and side effects including but not limited excessive drowsiness and altered mental states. We also discussed that there is always a risk not just to him but also people around him. he has been encouraged to call the office with any questions or concerns that should arise related to todays visit.  No orders of the defined types were placed in this encounter.       I have personally obtained a history, examined the patient, evaluated laboratory and imaging results, formulated the assessment and plan and placed orders. This patient was seen today by  Tressie Ellis, PA-C in collaboration with Dr. Devona Konig.   Allyne Gee, MD [facility] Diplomate ABMS Pulmonary Critical Care Medicine and Sleep Medicine

## 2021-08-25 ENCOUNTER — Other Ambulatory Visit: Payer: Self-pay

## 2021-09-30 DIAGNOSIS — H40003 Preglaucoma, unspecified, bilateral: Secondary | ICD-10-CM | POA: Diagnosis not present

## 2021-11-21 DIAGNOSIS — Z79899 Other long term (current) drug therapy: Secondary | ICD-10-CM | POA: Diagnosis not present

## 2021-11-21 DIAGNOSIS — N401 Enlarged prostate with lower urinary tract symptoms: Secondary | ICD-10-CM | POA: Diagnosis not present

## 2021-11-21 DIAGNOSIS — R972 Elevated prostate specific antigen [PSA]: Secondary | ICD-10-CM | POA: Diagnosis not present

## 2021-11-21 DIAGNOSIS — E291 Testicular hypofunction: Secondary | ICD-10-CM | POA: Diagnosis not present

## 2021-11-22 DIAGNOSIS — E291 Testicular hypofunction: Secondary | ICD-10-CM | POA: Diagnosis not present

## 2021-11-22 DIAGNOSIS — N401 Enlarged prostate with lower urinary tract symptoms: Secondary | ICD-10-CM | POA: Diagnosis not present

## 2021-11-22 DIAGNOSIS — Z79899 Other long term (current) drug therapy: Secondary | ICD-10-CM | POA: Diagnosis not present

## 2022-02-13 ENCOUNTER — Other Ambulatory Visit: Payer: Self-pay

## 2022-02-13 DIAGNOSIS — I10 Essential (primary) hypertension: Secondary | ICD-10-CM

## 2022-02-13 MED ORDER — LOSARTAN POTASSIUM 25 MG PO TABS: 25.0000 mg | ORAL_TABLET | Freq: Every day | ORAL | 3 refills | Status: DC

## 2022-05-15 ENCOUNTER — Other Ambulatory Visit: Payer: Self-pay

## 2022-05-15 DIAGNOSIS — E0800 Diabetes mellitus due to underlying condition with hyperosmolarity without nonketotic hyperglycemic-hyperosmolar coma (NKHHC): Secondary | ICD-10-CM

## 2022-05-15 MED ORDER — METFORMIN HCL 850 MG PO TABS: 850.0000 mg | ORAL_TABLET | Freq: Two times a day (BID) | ORAL | 0 refills | Status: DC

## 2022-06-26 ENCOUNTER — Ambulatory Visit: Payer: Self-pay

## 2022-06-26 DIAGNOSIS — E291 Testicular hypofunction: Secondary | ICD-10-CM

## 2022-06-26 DIAGNOSIS — Z0000 Encounter for general adult medical examination without abnormal findings: Secondary | ICD-10-CM

## 2022-06-26 LAB — POCT URINALYSIS DIPSTICK
Bilirubin, UA: NEGATIVE
Blood, UA: POSITIVE
Glucose, UA: NEGATIVE
Ketones, UA: NEGATIVE
Leukocytes, UA: NEGATIVE
Nitrite, UA: NEGATIVE
Protein, UA: NEGATIVE
Spec Grav, UA: 1.02 (ref 1.010–1.025)
Urobilinogen, UA: 0.2 E.U./dL
pH, UA: 6 (ref 5.0–8.0)

## 2022-06-26 NOTE — Progress Notes (Signed)
Scheduled to complete his physical outside the clinic on 06/29/2022 with Dr. Daryel November.  Completed labs, EKG & Hearing Screen through [facility].  Requested to have Testosterone level added to labs.  States he hasn't used the topical testosterone in 3-4 months.  Previous urologist was Dr. Evelene Croon, and he retired at the end of 2023.  Hasn't started seeing a new urologist.  AMD

## 2022-06-27 LAB — CMP12+LP+TP+TSH+6AC+PSA+CBC…
ALT: 28 IU/L (ref 0–44)
AST: 21 IU/L (ref 0–40)
Albumin: 4.1 g/dL (ref 3.9–4.9)
Alkaline Phosphatase: 79 IU/L (ref 44–121)
BUN/Creatinine Ratio: 11 (ref 10–24)
BUN: 10 mg/dL (ref 8–27)
Basophils Absolute: 0.1 10*3/uL (ref 0.0–0.2)
Basos: 1 %
Bilirubin Total: 0.5 mg/dL (ref 0.0–1.2)
Calcium: 8.9 mg/dL (ref 8.6–10.2)
Chloride: 100 mmol/L (ref 96–106)
Chol/HDL Ratio: 4.2 ratio (ref 0.0–5.0)
Cholesterol, Total: 164 mg/dL (ref 100–199)
Creatinine, Ser: 0.94 mg/dL (ref 0.76–1.27)
EOS (ABSOLUTE): 0.2 10*3/uL (ref 0.0–0.4)
Eos: 2 %
Estimated CHD Risk: 0.8 times avg. (ref 0.0–1.0)
Free Thyroxine Index: 2.1 (ref 1.2–4.9)
GGT: 20 IU/L (ref 0–65)
Globulin, Total: 2.8 g/dL (ref 1.5–4.5)
Glucose: 109 mg/dL — ABNORMAL HIGH (ref 70–99)
HDL: 39 mg/dL — ABNORMAL LOW (ref >39)
Hematocrit: 45.8 % (ref 37.5–51.0)
Hemoglobin: 15.9 g/dL (ref 13.0–17.7)
Immature Grans (Abs): 0 10*3/uL (ref 0.0–0.1)
Immature Granulocytes: 0 %
Iron: 67 ug/dL (ref 38–169)
LDH: 192 IU/L (ref 121–224)
LDL Chol Calc (NIH): 109 mg/dL — ABNORMAL HIGH (ref 0–99)
Lymphocytes Absolute: 2.8 10*3/uL (ref 0.7–3.1)
Lymphs: 31 %
MCH: 30.1 pg (ref 26.6–33.0)
MCHC: 34.7 g/dL (ref 31.5–35.7)
MCV: 87 fL (ref 79–97)
Monocytes Absolute: 0.7 10*3/uL (ref 0.1–0.9)
Monocytes: 7 %
Neutrophils Absolute: 5.4 10*3/uL (ref 1.4–7.0)
Neutrophils: 59 %
Phosphorus: 3.5 mg/dL (ref 2.8–4.1)
Platelets: 207 10*3/uL (ref 150–450)
Potassium: 4.5 mmol/L (ref 3.5–5.2)
Prostate Specific Ag, Serum: 3.5 ng/mL (ref 0.0–4.0)
RBC: 5.29 x10E6/uL (ref 4.14–5.80)
RDW: 12.7 % (ref 11.6–15.4)
Sodium: 137 mmol/L (ref 134–144)
T3 Uptake Ratio: 25 % (ref 24–39)
T4, Total: 8.4 ug/dL (ref 4.5–12.0)
TSH: 1.91 u[IU]/mL (ref 0.450–4.500)
Total Protein: 6.9 g/dL (ref 6.0–8.5)
Triglycerides: 83 mg/dL (ref 0–149)
Uric Acid: 5.4 mg/dL (ref 3.8–8.4)
VLDL Cholesterol Cal: 16 mg/dL (ref 5–40)
WBC: 9.2 10*3/uL (ref 3.4–10.8)
eGFR: 92 mL/min/{1.73_m2} (ref >59)

## 2022-06-27 LAB — MICROALBUMIN / CREATININE URINE RATIO
Creatinine, Urine: 116.6 mg/dL
Microalb/Creat Ratio: 4 mg/g creat (ref 0–29)
Microalbumin, Urine: 4.2 ug/mL

## 2022-06-27 LAB — HGB A1C W/O EAG: Hgb A1c MFr Bld: 6.5 % — ABNORMAL HIGH (ref 4.8–5.6)

## 2022-06-27 LAB — TESTOSTERONE: Testosterone: 167 ng/dL — ABNORMAL LOW (ref 264–916)

## 2022-06-29 ENCOUNTER — Ambulatory Visit: Payer: Self-pay | Admitting: Adult Health

## 2022-06-29 DIAGNOSIS — D229 Melanocytic nevi, unspecified: Secondary | ICD-10-CM

## 2022-06-29 NOTE — Progress Notes (Signed)
Concern over possible spot/mole on upper left back sent from Dr. Mayford Knife after annual exam.  Asking for dermatology referral.

## 2022-06-29 NOTE — Progress Notes (Signed)
[facility] [address], [place] [address]   Office Visit Note  Patient Name: Paul Pitts  [id number]  [id number]  Date of Service: 06/29/2022  Chief Complaint  Patient presents with   Nevus     HPI Pt is here requesting referral for mole on back.  He had his annual exam performed by Dr. Mayford Knife and was told that this mole on his back looked suspicious.  He recommended dermatology referral, however [facility] can not see him for 6 months.      Current Medication:  Outpatient Encounter Medications as of 06/29/2022  Medication Sig   dutasteride (AVODART) 0.5 MG capsule Take 0.5 mg by mouth daily.   hydrochlorothiazide (HYDRODIURIL) 50 MG tablet Take 1 tablet (50 mg total) by mouth daily.   hydrochlorothiazide (HYDRODIURIL) 50 MG tablet Take 1 tablet by mouth daily.   losartan (COZAAR) 25 MG tablet Take 1 tablet (25 mg total) by mouth daily.   metFORMIN (GLUCOPHAGE) 850 MG tablet Take 1 tablet (850 mg total) by mouth 2 (two) times daily with a meal.   Testosterone 20.25 MG/ACT (1.62%) GEL APP 3 PUMPS QAM ALTERNATING WITH 2 PUMPS QAM   No facility-administered encounter medications on file as of 06/29/2022.      Medical History: No past medical history on file.   Vital Signs: There were no vitals taken for this visit.   Review of Systems  Skin:  Positive for color change.    Physical Exam Constitutional:      Appearance: Normal appearance.  Skin:    Findings: Lesion present.     Comments: Large "stuck on" lesion that appears soft, and possibly blood/fluid filled.  No redness but patient believes it has changed size and become darker.   Neurological:     Mental Status: He is alert.    Assessment/Plan: 1. Nevus Referral to [facility].  - Ambulatory referral to Dermatology     General Counseling: derrian poli understanding of the findings of todays visit and agrees with plan of treatment. I have discussed any further  diagnostic evaluation that may be needed or ordered today. We also reviewed his medications today. he has been encouraged to call the office with any questions or concerns that should arise related to todays visit.   Orders Placed This Encounter  Procedures   Ambulatory referral to Dermatology    No orders of the defined types were placed in this encounter.   Time spent:20 Minutes    Johnna Acosta AGNP-C Nurse Practitioner

## 2022-07-10 ENCOUNTER — Ambulatory Visit (INDEPENDENT_AMBULATORY_CARE_PROVIDER_SITE_OTHER): Payer: 59 | Admitting: Internal Medicine

## 2022-07-10 VITALS — BP 140/79 | HR 65 | Resp 18 | Ht 68.0 in | Wt 266.0 lb

## 2022-07-10 DIAGNOSIS — Z7189 Other specified counseling: Secondary | ICD-10-CM

## 2022-07-10 DIAGNOSIS — G4733 Obstructive sleep apnea (adult) (pediatric): Secondary | ICD-10-CM | POA: Diagnosis not present

## 2022-07-10 DIAGNOSIS — I10 Essential (primary) hypertension: Secondary | ICD-10-CM | POA: Diagnosis not present

## 2022-07-10 NOTE — Patient Instructions (Signed)

## 2022-07-10 NOTE — Progress Notes (Signed)
[facility] [address] [place], [place] [address]  Pulmonary Sleep Medicine   Office Visit Note  Patient Name: Paul Pitts DOB: September 26, 1960 MRN [id number]    Chief Complaint: Obstructive Sleep Apnea visit  Brief History:  Paul Pitts is seen today for an annual follow up on APAP at 5-20 cmh20.  The patient has a 2.5 year history of sleep apnea. Patient is not using PAP nightly.  The patient feels not as rested after sleeping with PAP.  The patient reports benefiting from PAP use. Reported sleepiness has not changed and the Epworth Sleepiness Score is 4 out of 24. The patient does not take naps. The patient complains of the following: pt claims he falls asleep on the recliner and will fall asleep prior to wearing cpap machine.  The compliance download shows 25% compliance with an average use time of 2:42 hours. The AHI is 3.2.  The patient does not complain of limb movements disrupting sleep. The patient continues to require PAP therapy in order to eliminate sleep apnea.   ROS  General: (-) fever, (-) chills, (-) night sweat Nose and Sinuses: (-) nasal stuffiness or itchiness, (-) postnasal drip, (-) nosebleeds, (-) sinus trouble. Mouth and Throat: (-) sore throat, (-) hoarseness. Neck: (-) swollen glands, (-) enlarged thyroid, (-) neck pain. Respiratory: + cough, - shortness of breath, - wheezing. Neurologic: - numbness, - tingling. Psychiatric: - anxiety, - depression   Current Medication: Outpatient Encounter Medications as of 07/10/2022  Medication Sig   losartan (COZAAR) 25 MG tablet Take 1 tablet (25 mg total) by mouth daily.   metFORMIN (GLUCOPHAGE) 850 MG tablet Take 1 tablet (850 mg total) by mouth 2 (two) times daily with a meal.   Testosterone 20.25 MG/ACT (1.62%) GEL APP 3 PUMPS QAM ALTERNATING WITH 2 PUMPS QAM   [DISCONTINUED] dutasteride (AVODART) 0.5 MG capsule Take 0.5 mg by mouth daily.   [DISCONTINUED] hydrochlorothiazide (HYDRODIURIL) 50 MG tablet Take 1  tablet (50 mg total) by mouth daily.   [DISCONTINUED] hydrochlorothiazide (HYDRODIURIL) 50 MG tablet Take 1 tablet by mouth daily.   No facility-administered encounter medications on file as of 07/10/2022.    Surgical History: No past surgical history on file.  Medical History: No past medical history on file.  Family History: Non contributory to the present illness  Social History: Social History   Socioeconomic History   Marital status: Married    Spouse name: Not on file   Number of children: Not on file   Years of education: Not on file   Highest education level: Not on file  Occupational History   Not on file  Tobacco Use   Smoking status: Never   Smokeless tobacco: Never  Substance and Sexual Activity   Alcohol use: Yes   Drug use: Not on file   Sexual activity: Not on file  Other Topics Concern   Not on file  Social History Narrative   Not on file   Social Determinants of Health   Financial Resource Strain: Not on file  Food Insecurity: Not on file  Transportation Needs: Not on file  Physical Activity: Not on file  Stress: Not on file  Social Connections: Not on file  Intimate Partner Violence: Not on file    Vital Signs: Blood pressure (!) 140/79, pulse 65, resp. rate 18, height 5\' 8"  (1.727 m), weight 266 lb (120.7 kg), SpO2 95 %. Body mass index is 40.45 kg/m.    Examination: General Appearance: The patient is well-developed, well-nourished, and  in no distress. Neck Circumference: 49 cm Skin: Gross inspection of skin unremarkable. Head: normocephalic, no gross deformities. Eyes: no gross deformities noted. ENT: ears appear grossly normal Neurologic: Alert and oriented. No involuntary movements.  STOP BANG RISK ASSESSMENT S (snore) Have you been told that you snore?     YES   T (tired) Are you often tired, fatigued, or sleepy during the [place]?   YES  O (obstruction) Do you stop breathing, choke, or gasp during sleep? NO   P (pressure) Do you  have or are you being treated for high blood pressure? YES   B (BMI) Is your body index greater than 35 kg/m? YES   A (age) Are you 60 years old or older? YES   N (neck) Do you have a neck circumference greater than 16 inches?   NO   G (gender) Are you a male? YES   TOTAL STOP/BANG "YES" ANSWERS 6       A STOP-Bang score of 2 or less is considered low risk, and a score of 5 or more is high risk for having either moderate or severe OSA. For people who score 3 or 4, doctors may need to perform further assessment to determine how likely they are to have OSA.         EPWORTH SLEEPINESS SCALE:  Scale:  (0)= no chance of dozing; (1)= slight chance of dozing; (2)= moderate chance of dozing; (3)= high chance of dozing  Chance  Situtation    Sitting and reading: 1    Watching TV: 1    Sitting Inactive in public: 1    As a passenger in car: 0      Lying down to rest: 1    Sitting and talking: 0    Sitting quielty after lunch: 0    In a car, stopped in traffic: 0   TOTAL SCORE:   4 out of 24    SLEEP STUDIES:  HST (12/04/19) RDI 30.6, low SPO2 74%   CPAP COMPLIANCE DATA:  Date Range: 07/04/21 - 07/03/22  Average Daily Use: 2:42 hours  Median Use: 3:40 hours  Compliance for > 4 Hours: 93 days  AHI: 3.2 respiratory events per hour  Days Used: 267/365  Mask Leak: 5.3  95th Percentile Pressure: 15.7 cmh20         LABS: Recent Results (from the past 2160 hour(s))  Male Executive Panel     Status: Abnormal   Collection Time: 06/26/22  9:32 AM  Result Value Ref Range   Glucose 109 (H) 70 - 99 mg/dL   Uric Acid 5.4 3.8 - 8.4 mg/dL    Comment:            Therapeutic target for gout patients: <6.0   BUN 10 8 - 27 mg/dL   Creatinine, Ser 1.61 0.76 - 1.27 mg/dL   eGFR 92 >09 UE/AVW/0.98   BUN/Creatinine Ratio 11 10 - 24   Sodium 137 134 - 144 mmol/L   Potassium 4.5 3.5 - 5.2 mmol/L   Chloride 100 96 - 106 mmol/L   Calcium 8.9 8.6 - 10.2 mg/dL    Phosphorus 3.5 2.8 - 4.1 mg/dL   Total Protein 6.9 6.0 - 8.5 g/dL   Albumin 4.1 3.9 - 4.9 g/dL   Globulin, Total 2.8 1.5 - 4.5 g/dL   Bilirubin Total 0.5 0.0 - 1.2 mg/dL   Alkaline Phosphatase 79 44 - 121 IU/L   LDH 192 121 - 224 IU/L   AST 21  0 - 40 IU/L   ALT 28 0 - 44 IU/L   GGT 20 0 - 65 IU/L   Iron 67 38 - 169 ug/dL   Cholesterol, Total 161 100 - 199 mg/dL   Triglycerides 83 0 - 149 mg/dL   HDL 39 (L) >09 mg/dL   VLDL Cholesterol Cal 16 5 - 40 mg/dL   LDL Chol Calc (NIH) 604 (H) 0 - 99 mg/dL   Chol/HDL Ratio 4.2 0.0 - 5.0 ratio    Comment:                                   T. Chol/HDL Ratio                                             Men  Women                               1/2 Avg.Risk  3.4    3.3                                   Avg.Risk  5.0    4.4                                2X Avg.Risk  9.6    7.1                                3X Avg.Risk 23.4   11.0    Estimated CHD Risk 0.8 0.0 - 1.0 times avg.    Comment: The CHD Risk is based on the T. Chol/HDL ratio. Other factors affect CHD Risk such as hypertension, smoking, diabetes, severe obesity, and family history of premature CHD.    TSH 1.910 0.450 - 4.500 uIU/mL   T4, Total 8.4 4.5 - 12.0 ug/dL   T3 Uptake Ratio 25 24 - 39 %   Free Thyroxine Index 2.1 1.2 - 4.9   Prostate Specific Ag, Serum 3.5 0.0 - 4.0 ng/mL    Comment: Roche ECLIA methodology. According to the American Urological Association, Serum PSA should decrease and remain at undetectable levels after radical prostatectomy. The AUA defines biochemical recurrence as an initial PSA value 0.2 ng/mL or greater followed by a subsequent confirmatory PSA value 0.2 ng/mL or greater. Values obtained with different assay methods or kits cannot be used interchangeably. Results cannot be interpreted as absolute evidence of the presence or absence of malignant disease.    WBC 9.2 3.4 - 10.8 x10E3/uL   RBC 5.29 4.14 - 5.80 x10E6/uL   Hemoglobin 15.9 13.0 - 17.7  g/dL   Hematocrit 54.0 98.1 - 51.0 %   MCV 87 79 - 97 fL   MCH 30.1 26.6 - 33.0 pg   MCHC 34.7 31.5 - 35.7 g/dL   RDW 19.1 47.8 - 29.5 %   Platelets 207 150 - 450 x10E3/uL   Neutrophils 59 Not Estab. %   Lymphs 31 Not Estab. %   Monocytes 7 Not Estab. %   Eos 2 Not Estab. %   Basos  1 Not Estab. %   Neutrophils Absolute 5.4 1.4 - 7.0 x10E3/uL   Lymphocytes Absolute 2.8 0.7 - 3.1 x10E3/uL   Monocytes Absolute 0.7 0.1 - 0.9 x10E3/uL   EOS (ABSOLUTE) 0.2 0.0 - 0.4 x10E3/uL   Basophils Absolute 0.1 0.0 - 0.2 x10E3/uL   Immature Granulocytes 0 Not Estab. %   Immature Grans (Abs) 0.0 0.0 - 0.1 x10E3/uL  Hgb A1c w/o eAG     Status: Abnormal   Collection Time: 06/26/22  9:32 AM  Result Value Ref Range   Hgb A1c MFr Bld 6.5 (H) 4.8 - 5.6 %    Comment:          Prediabetes: 5.7 - 6.4          Diabetes: >6.4          Glycemic control for adults with diabetes: <7.0   Microalbumin / creatinine urine ratio     Status: None   Collection Time: 06/26/22  9:32 AM  Result Value Ref Range   Creatinine, Urine 116.6 Not Estab. mg/dL   Microalbumin, Urine 4.2 Not Estab. ug/mL   Microalb/Creat Ratio 4 0 - 29 mg/g creat    Comment:                        Normal:                0 -  29                        Moderately increased: 30 - 300                        Severely increased:       >300   Testosterone     Status: Abnormal   Collection Time: 06/26/22  9:32 AM  Result Value Ref Range   Testosterone 167 (L) 264 - 916 ng/dL    Comment: Adult male reference interval is based on a population of healthy nonobese males (BMI <30) between 58 and 44 years old. Travison, et.al. JCEM [phone]. PMID: [id number].   POCT Urinalysis Dipstick (CPT 81002)     Status: Abnormal   Collection Time: 06/26/22  9:46 AM  Result Value Ref Range   Color, UA yellow    Clarity, UA clear    Glucose, UA Negative Negative   Bilirubin, UA neg    Ketones, UA neg    Spec Grav, UA 1.020 1.010 - 1.025   Blood, UA  pos     Comment: 10   pH, UA 6.0 5.0 - 8.0   Protein, UA Negative Negative   Urobilinogen, UA 0.2 0.2 or 1.0 E.U./dL   Nitrite, UA neg    Leukocytes, UA Negative Negative   Appearance     Odor      Radiology: DG Lumbar Spine 2-3 Views  Result Date: 02/19/2020 CLINICAL DATA:  Left-sided low back pain radiating to left hip 1 year. Symptoms worse over the past 2 weeks. EXAM: LUMBAR SPINE - 2-3 VIEW COMPARISON:  None. FINDINGS: Subtle curvature of the lumbar spine convex right. There is moderate spondylosis of the lumbar spine to include moderate facet arthropathy over the lower lumbar spine. Vertebral body heights are maintained. Mild multilevel disc space narrowing throughout the lumbar spine. No compression fracture or significant spondylolisthesis. Degenerative changes of the sacroiliac joints. IMPRESSION: 1. No acute findings. 2. 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 Electronically Signed   By: Elberta Fortis M.D.   On: 02/19/2020 15:19    No results found.  No results found.    Assessment and Plan: Patient Active Problem List   Diagnosis Date Noted   OSA on CPAP 06/28/2020   CPAP use counseling 06/28/2020   Morbid obesity (HCC) 06/28/2020   Class 2 severe obesity due to excess calories with serious comorbidity in adult [facility]) 02/17/2020   Hypogonadism in male 09/24/2018   HTN (hypertension) 09/24/2018   Increased prostate specific antigen (PSA) velocity 09/24/2018   IFG (impaired fasting glucose) 09/24/2018   Obesity 09/24/2018   Left ear hearing loss 09/24/2018   Allergic rhinitis 09/24/2018   Encounter for Department of Transportation (DOT) examination for driving license renewal 95/28/4132   History of colonic polyps 09/24/2018   1. OSA on CPAP The patient does tolerate PAP and reports minimal benefit from PAP use. The patient was reminded how to clean equipment and advised to replace supplies routinely. The patient was also counselled  on weight loss. The compliance is poor- this was discussed at length- he falls asleep in the recliner and doesn't wake to use his cpap more than a couple hours nightly. He is encouraged to use the cpap in his recliner instead of in bed. The AHI is 3.2.   OSA on cpap- controlled. Increase  compliance with pap. CPAP continues to be medically necessary to treat this patient's OSA. F/u one year.    2. CPAP use counseling CPAP Counseling: had a lengthy discussion with the patient regarding the importance of PAP therapy in management of the sleep apnea. Patient appears to understand the risk factor reduction and also understands the risks associated with untreated sleep apnea. Patient will try to make a good faith effort to remain compliant with therapy. Also instructed the patient on proper cleaning of the device including the water must be changed daily if possible and use of distilled water is preferred. Patient understands that the machine should be regularly cleaned with appropriate recommended cleaning solutions that do not damage the PAP machine for example given white vinegar and water rinses. Other methods such as ozone treatment may not be as good as these simple methods to achieve cleaning.   3. Primary hypertension Hypertension Counseling:   The following hypertensive lifestyle modification were recommended and discussed:  1. Limiting alcohol intake to less than 1 oz/Re[place]le of ethanol:(24 oz of beer or 8 oz of wine or 2 oz of 100-proof whiskey). 2. Take baby ASA 81 mg daily. 3. Importance of regular aerobic exercise and losing weight. 4. Reduce dietary saturated fat and cholesterol intake for overall cardiovascular health. 5. Maintaining adequate dietary potassium, calcium, and magnesium intake. 6. Regular monitoring of the blood pressure. 7. Reduce sodium intake to less than 100 mmol/Distel (less than 2.3 gm of sodium or less than 6 gm of sodium choride)      General Counseling: I have discussed  the findings of the evaluation and examination with Paul Hua.  I have also discussed any further diagnostic evaluation thatmay be needed or ordered today. Paul Hua verbalizes understanding of the findings of todays visit. We also reviewed his medications today and discussed drug interactions and side effects including but not limited excessive drowsiness and altered mental states. We also discussed that there is always a risk not just to him but also people around him. he has been encouraged to call the office with any questions or concerns that should arise related to todays  visit.  No orders of the defined types were placed in this encounter.       I have personally obtained a history, examined the patient, evaluated laboratory and imaging results, formulated the assessment and plan and placed orders. This patient was seen today by Emmaline Kluver, PA-C in collaboration with Dr. Freda Munro.   Yevonne Pax, MD [facility] Diplomate ABMS Pulmonary Critical Care Medicine and Sleep Medicine

## 2022-08-04 IMAGING — CR DG LUMBAR SPINE 2-3V
1 series · 3 of 3 positions shown · non-contrast
Comparison: None.

CLINICAL DATA: Left-sided low back pain radiating to left hip 1
year. Symptoms worse over the past 2 weeks.

EXAM:
LUMBAR SPINE - 2-3 VIEW

[Series 1: dg lumbar spine 2-3 views · 0.14mm/px · 3 of 3 slices shown]
[im 1/3]
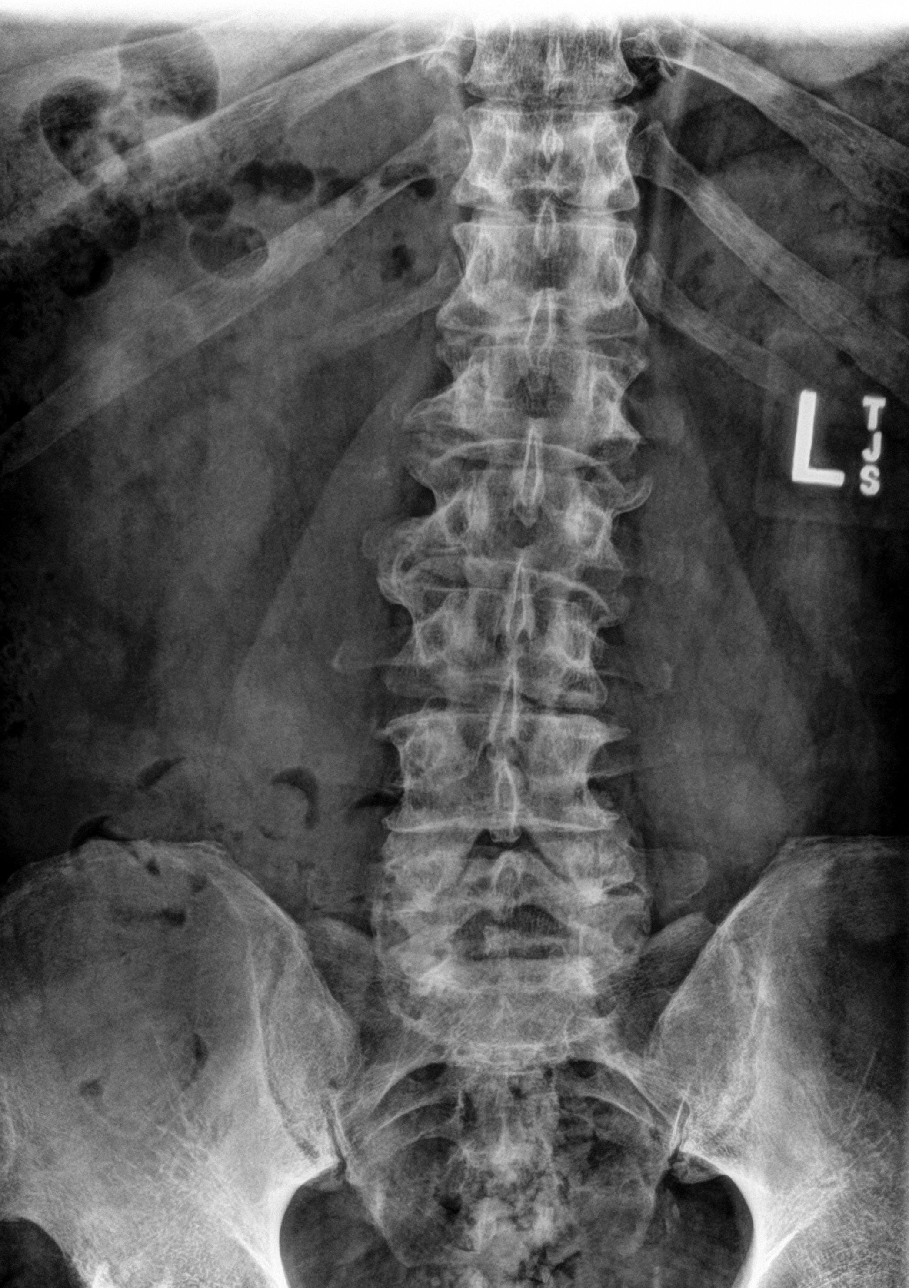
[im 2/3]
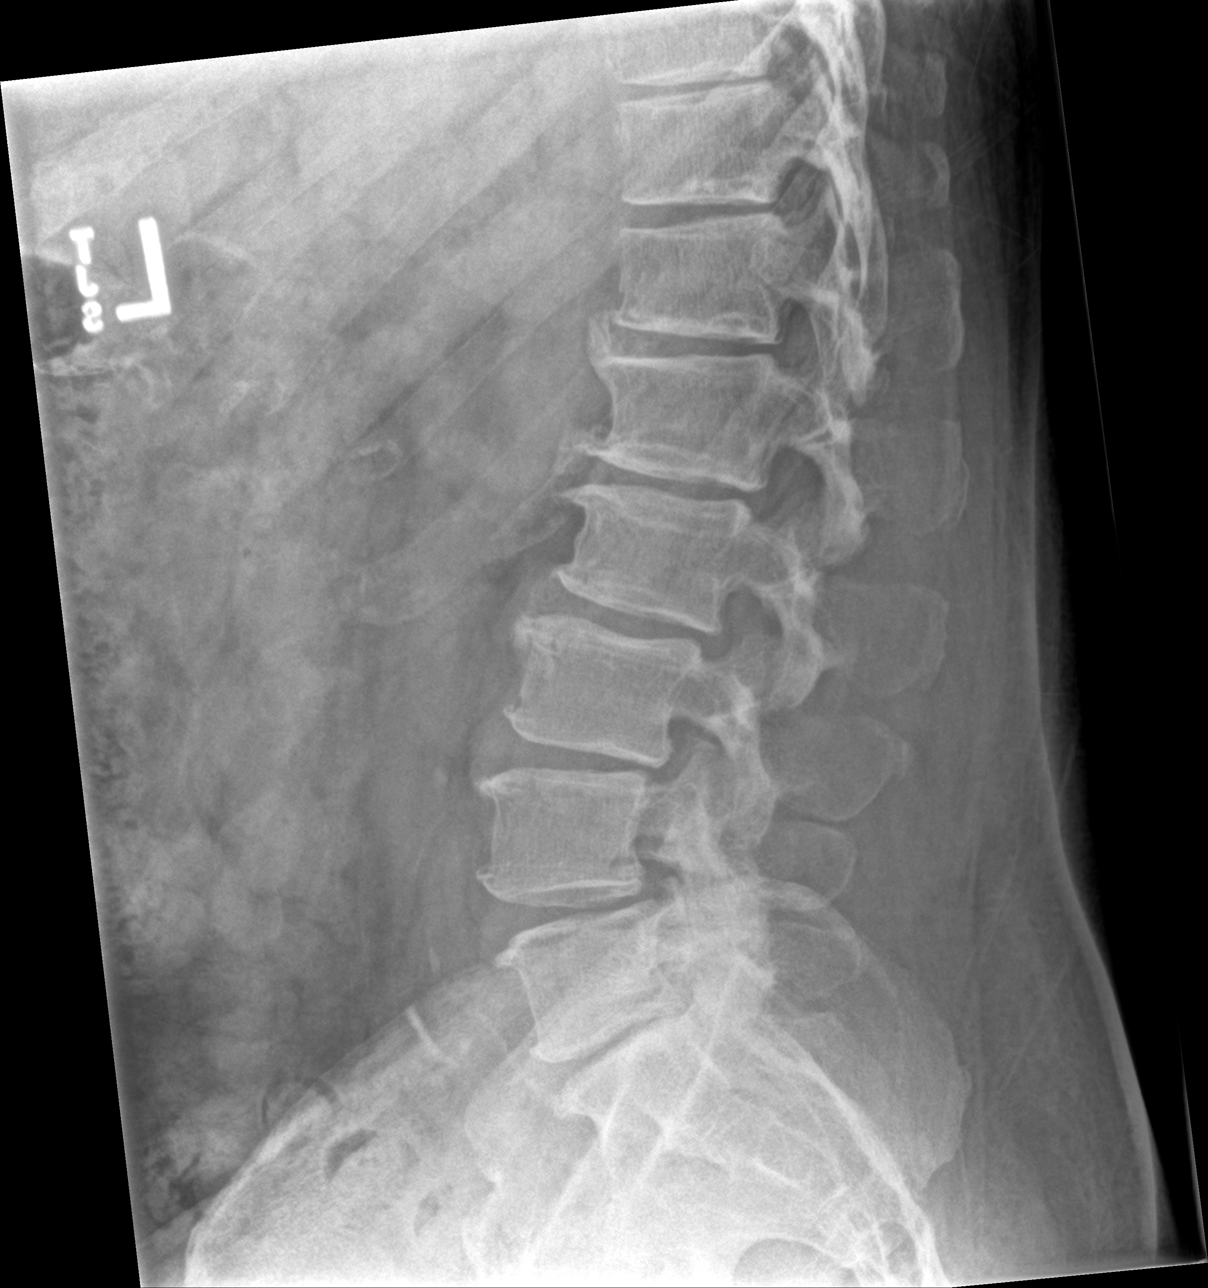
[im 3/3]
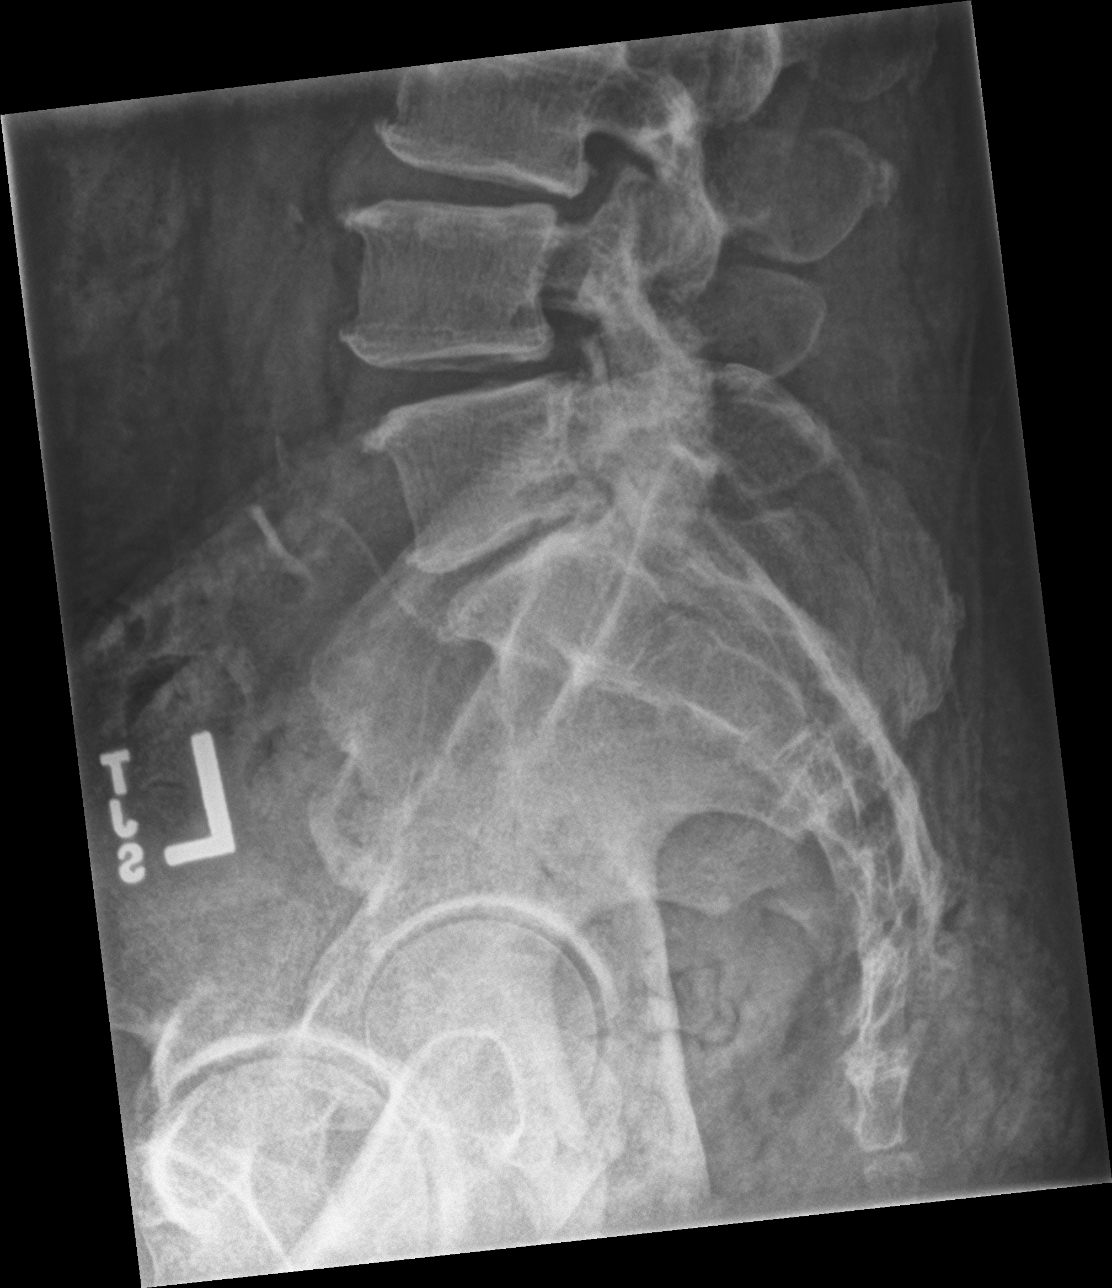

[3 of 3 positions shown; findings below may reference images not displayed]

FINDINGS: Subtle curvature of the lumbar spine convex right. There is moderate
spondylosis of the lumbar spine to include moderate facet
arthropathy over the lower lumbar spine. Vertebral body heights are
maintained. Mild multilevel disc space narrowing throughout the
lumbar spine. No compression fracture or significant
spondylolisthesis. Degenerative changes of the sacroiliac joints.
IMPRESSION: 1. No acute findings.
2. Moderate spondylosis of the lumbar spine with mild multilevel
disc disease.

## 2022-08-09 ENCOUNTER — Other Ambulatory Visit: Payer: Self-pay

## 2022-08-09 DIAGNOSIS — E0800 Diabetes mellitus due to underlying condition with hyperosmolarity without nonketotic hyperglycemic-hyperosmolar coma (NKHHC): Secondary | ICD-10-CM

## 2022-08-09 MED ORDER — METFORMIN HCL 850 MG PO TABS
850.0000 mg | ORAL_TABLET | Freq: Two times a day (BID) | ORAL | 3 refills | Status: DC
Start: 2022-08-09 — End: 2023-10-04

## 2022-10-03 DIAGNOSIS — E119 Type 2 diabetes mellitus without complications: Secondary | ICD-10-CM | POA: Diagnosis not present

## 2022-10-03 DIAGNOSIS — H40003 Preglaucoma, unspecified, bilateral: Secondary | ICD-10-CM | POA: Diagnosis not present

## 2022-10-03 DIAGNOSIS — H43813 Vitreous degeneration, bilateral: Secondary | ICD-10-CM | POA: Diagnosis not present

## 2022-11-27 DIAGNOSIS — D1801 Hemangioma of skin and subcutaneous tissue: Secondary | ICD-10-CM | POA: Diagnosis not present

## 2023-06-07 ENCOUNTER — Ambulatory Visit: Payer: Self-pay

## 2023-06-07 DIAGNOSIS — Z0000 Encounter for general adult medical examination without abnormal findings: Secondary | ICD-10-CM

## 2023-06-07 LAB — POCT URINALYSIS DIPSTICK
Bilirubin, UA: NEGATIVE
Glucose, UA: NEGATIVE
Ketones, UA: NEGATIVE
Leukocytes, UA: NEGATIVE
Nitrite, UA: NEGATIVE
Protein, UA: NEGATIVE
Spec Grav, UA: 1.015 (ref 1.010–1.025)
Urobilinogen, UA: 0.2 U/dL
pH, UA: 6 (ref 5.0–8.0)

## 2023-06-07 NOTE — Progress Notes (Signed)
 Presents for annual hearing screen.  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  The department is part of the [place]'s Hearing [place] program.  Annual Hearing screen shows possible OSHA recordable STS in left ear.  [facility] hearing retest scheduled for 06/07/2023 at 8:30 am

## 2023-06-08 ENCOUNTER — Other Ambulatory Visit: Payer: Self-pay | Admitting: Physician Assistant

## 2023-06-08 DIAGNOSIS — R972 Elevated prostate specific antigen [PSA]: Secondary | ICD-10-CM

## 2023-06-08 LAB — CMP12+LP+TP+TSH+6AC+PSA+CBC…
ALT: 21 IU/L (ref 0–44)
AST: 20 IU/L (ref 0–40)
Albumin: 4.3 g/dL (ref 3.9–4.9)
Alkaline Phosphatase: 81 IU/L (ref 44–121)
BUN/Creatinine Ratio: 13 (ref 10–24)
BUN: 12 mg/dL (ref 8–27)
Basophils Absolute: 0.1 10*3/uL (ref 0.0–0.2)
Basos: 1 %
Bilirubin Total: 0.7 mg/dL (ref 0.0–1.2)
Calcium: 9.2 mg/dL (ref 8.6–10.2)
Chloride: 99 mmol/L (ref 96–106)
Chol/HDL Ratio: 4.4 ratio (ref 0.0–5.0)
Cholesterol, Total: 164 mg/dL (ref 100–199)
Creatinine, Ser: 0.96 mg/dL (ref 0.76–1.27)
EOS (ABSOLUTE): 0.2 10*3/uL (ref 0.0–0.4)
Eos: 2 %
Estimated CHD Risk: 0.9 times avg. (ref 0.0–1.0)
Free Thyroxine Index: 2.1 (ref 1.2–4.9)
GGT: 18 IU/L (ref 0–65)
Globulin, Total: 2.8 g/dL (ref 1.5–4.5)
Glucose: 116 mg/dL — ABNORMAL HIGH (ref 70–99)
HDL: 37 mg/dL — ABNORMAL LOW (ref >39)
Hematocrit: 49.8 % (ref 37.5–51.0)
Hemoglobin: 16.5 g/dL (ref 13.0–17.7)
Immature Grans (Abs): 0 10*3/uL (ref 0.0–0.1)
Immature Granulocytes: 0 %
Iron: 122 ug/dL (ref 38–169)
LDH: 218 IU/L (ref 121–224)
LDL Chol Calc (NIH): 112 mg/dL — ABNORMAL HIGH (ref 0–99)
Lymphocytes Absolute: 2.7 10*3/uL (ref 0.7–3.1)
Lymphs: 30 %
MCH: 29.2 pg (ref 26.6–33.0)
MCHC: 33.1 g/dL (ref 31.5–35.7)
MCV: 88 fL (ref 79–97)
Monocytes Absolute: 0.7 10*3/uL (ref 0.1–0.9)
Monocytes: 7 %
Neutrophils Absolute: 5.5 10*3/uL (ref 1.4–7.0)
Neutrophils: 60 %
Phosphorus: 3.4 mg/dL (ref 2.8–4.1)
Platelets: 184 10*3/uL (ref 150–450)
Potassium: 4.6 mmol/L (ref 3.5–5.2)
Prostate Specific Ag, Serum: 8.1 ng/mL — ABNORMAL HIGH (ref 0.0–4.0)
RBC: 5.65 x10E6/uL (ref 4.14–5.80)
RDW: 13.7 % (ref 11.6–15.4)
Sodium: 137 mmol/L (ref 134–144)
T3 Uptake Ratio: 27 % (ref 24–39)
T4, Total: 7.6 ug/dL (ref 4.5–12.0)
TSH: 1.79 u[IU]/mL (ref 0.450–4.500)
Total Protein: 7.1 g/dL (ref 6.0–8.5)
Triglycerides: 77 mg/dL (ref 0–149)
Uric Acid: 5.6 mg/dL (ref 3.8–8.4)
VLDL Cholesterol Cal: 15 mg/dL (ref 5–40)
WBC: 9.2 10*3/uL (ref 3.4–10.8)
eGFR: 89 mL/min/{1.73_m2} (ref >59)

## 2023-06-13 ENCOUNTER — Other Ambulatory Visit: Payer: Self-pay

## 2023-06-13 DIAGNOSIS — R5383 Other fatigue: Secondary | ICD-10-CM

## 2023-06-13 NOTE — Progress Notes (Signed)
 Pt presents today to complete labs for dr. Broadus Canes.

## 2023-06-14 LAB — VITAMIN D 25 HYDROXY (VIT D DEFICIENCY, FRACTURES): Vit D, 25-Hydroxy: 31.8 ng/mL (ref 30.0–100.0)

## 2023-06-14 LAB — SPECIMEN STATUS REPORT

## 2023-06-14 LAB — TESTOSTERONE: Testosterone: 412 ng/dL (ref 264–916)

## 2023-06-14 LAB — HGB A1C W/O EAG: Hgb A1c MFr Bld: 7.2 % — ABNORMAL HIGH (ref 4.8–5.6)

## 2023-06-14 LAB — VITAMIN B12: Vitamin B-12: 348 pg/mL (ref 232–1245)

## 2023-06-14 LAB — ESTRADIOL: Estradiol: 38.9 pg/mL (ref 7.6–42.6)

## 2023-06-29 ENCOUNTER — Ambulatory Visit: Payer: Self-pay

## 2023-06-29 DIAGNOSIS — R94120 Abnormal auditory function study: Secondary | ICD-10-CM

## 2023-06-29 NOTE — Progress Notes (Signed)
 Annual Hearing Screen completed on 06/07/2023 & showed possible OSHA recordable STS in left ear.   Here today for 30 [facility] Hearing Retest.   Today's hearing screen still shows possible recordable STS I nthe left ear   Appointment scheduled for 07/03/2023 at 3:00 pm to see Dr. Debby Later [facility] Medical Director for [employer]) to evaluation & determination if possible STS is OSHA recordable.

## 2023-07-05 NOTE — Progress Notes (Signed)
 [facility] [address] [place], [place] [address]  Pulmonary Sleep Medicine   Office Visit Note  Patient Name: Paul Pitts DOB: 12/29/1960 MRN [id number]    Chief Complaint: Obstructive Sleep Apnea visit  Brief History:  Paul Pitts is seen today for an annual follow up visit for APAP@ 5-20 cmH2O. The patient has a 3.5 year history of sleep apnea. Patient is not using PAP nightly, in fact has not used it in a year.  The patient feels not rested after sleeping with PAP.  The patient reports not benefiting from PAP use. Reported sleepiness is  improved since sleeping in his recliner without the PAP and the Epworth Sleepiness Score is 2 out of 24. The patient does not take naps. The patient complains of the following: pt stated he felt like he was not getting enough air.  There is no data in the last year to show compliance due to no usage. The patient does not complain of limb movements disrupting sleep. The patient continues to require PAP therapy in order to eliminate sleep apnea.  ROS  General: (-) fever, (-) chills, (-) night sweat Nose and Sinuses: (-) nasal stuffiness or itchiness, (-) postnasal drip, (-) nosebleeds, (-) sinus trouble. Mouth and Throat: (-) sore throat, (-) hoarseness. Neck: (-) swollen glands, (-) enlarged thyroid, (-) neck pain. Respiratory: + cough, - shortness of breath, - wheezing. Neurologic: - numbness, - tingling. Psychiatric: - anxiety, - depression   Current Medication: Outpatient Encounter Medications as of 07/09/2023  Medication Sig   albuterol (VENTOLIN HFA) 108 (90 Base) MCG/ACT inhaler SMARTSIG:2 Puff(s) By Mouth Every 4 Hours PRN   amLODipine (NORVASC) 5 MG tablet Take 5 mg by mouth every morning.   dutasteride (AVODART) 0.5 MG capsule Take 0.5 mg by mouth daily.   Testosterone  20.25 MG/ACT (1.62%) GEL APP 3 PUMPS QAM ALTERNATING WITH 2 PUMPS QAM   losartan  (COZAAR ) 25 MG tablet Take 1 tablet (25 mg total) by mouth daily. (Patient not  taking: Reported on 07/09/2023)   metFORMIN  (GLUCOPHAGE ) 850 MG tablet Take 1 tablet (850 mg total) by mouth 2 (two) times daily with a meal. (Patient not taking: Reported on 07/09/2023)   No facility-administered encounter medications on file as of 07/09/2023.    Surgical History: No past surgical history on file.  Medical History: No past medical history on file.  Family History: Non contributory to the present illness  Social History: Social History   Socioeconomic History   Marital status: Married    Spouse name: Not on file   Number of children: Not on file   Years of education: Not on file   Highest education level: Not on file  Occupational History   Not on file  Tobacco Use   Smoking status: Never   Smokeless tobacco: Never  Substance and Sexual Activity   Alcohol use: Yes   Drug use: Not on file   Sexual activity: Not on file  Other Topics Concern   Not on file  Social History Narrative   Not on file   Social Drivers of Health   Financial Resource Strain: Not on file  Food Insecurity: Not on file  Transportation Needs: Not on file  Physical Activity: Not on file  Stress: Not on file  Social Connections: Not on file  Intimate Partner Violence: Not on file    Vital Signs: Blood pressure (!) 143/87, pulse 86, resp. rate 16, height 5' 8 (1.727 m), weight 269 lb (122 kg), SpO2 96%. Body mass  index is 40.9 kg/m.    Examination: General Appearance: The patient is well-developed, well-nourished, and in no distress. Neck Circumference: 49 cm Skin: Gross inspection of skin unremarkable. Head: normocephalic, no gross deformities. Eyes: no gross deformities noted. ENT: ears appear grossly normal Neurologic: Alert and oriented. No involuntary movements.  STOP BANG RISK ASSESSMENT S (snore) Have you been told that you snore?     NO   T (tired) Are you often tired, fatigued, or sleepy during the Paul Pitts?   YES  O (obstruction) Do you stop breathing, choke, or  gasp during sleep? YES   P (pressure) Do you have or are you being treated for high blood pressure? YES   B (BMI) Is your body index greater than 35 kg/m? YES   A (age) Are you 22 years old or older? YES   N (neck) Do you have a neck circumference greater than 16 inches?   YES   G (gender) Are you a male? YES   TOTAL STOP/BANG "YES" ANSWERS 7       A STOP-Bang score of 2 or less is considered low risk, and a score of 5 or more is high risk for having either moderate or severe OSA. For people who score 3 or 4, doctors may need to perform further assessment to determine how likely they are to have OSA.         EPWORTH SLEEPINESS SCALE:  Scale:  (0)= no chance of dozing; (1)= slight chance of dozing; (2)= moderate chance of dozing; (3)= high chance of dozing  Chance  Situtation    Sitting and reading: 1    Watching TV: 0    Sitting Inactive in public: 0    As a passenger in car: 1      Lying down to rest: 0    Sitting and talking: 0    Sitting quielty after lunch: 0    In a car, stopped in traffic: 0   TOTAL SCORE:   2 out of 24    SLEEP STUDIES:  HST (12/04/19) RDI 30.6, low SPO2 74%    CPAP COMPLIANCE DATA:  Date Range: No data available to show compliance. Pt has not used machine in over a year.  Average Daily Use: - hours  Median Use: -  Compliance for > 4 Hours: - days  AHI: - respiratory events per hour  Days Used: -  Mask Leak: -  95th Percentile Pressure: -         LABS: Recent Results (from the past 2160 hours)  CMP12+LP+TP+TSH+6AC+PSA+CBC.     Status: Abnormal   Collection Time: 06/07/23  9:29 AM  Result Value Ref Range   Glucose 116 (H) 70 - 99 mg/dL   Uric Acid 5.6 3.8 - 8.4 mg/dL    Comment:            Therapeutic target for gout patients: <6.0   BUN 12 8 - 27 mg/dL   Creatinine, Ser 9.03 0.76 - 1.27 mg/dL   eGFR 89 >40 fO/fpw/8.26   BUN/Creatinine Ratio 13 10 - 24   Sodium 137 134 - 144 mmol/L   Potassium 4.6 3.5 -  5.2 mmol/L   Chloride 99 96 - 106 mmol/L   Calcium 9.2 8.6 - 10.2 mg/dL   Phosphorus 3.4 2.8 - 4.1 mg/dL   Total Protein 7.1 6.0 - 8.5 g/dL   Albumin 4.3 3.9 - 4.9 g/dL   Globulin, Total 2.8 1.5 - 4.5 g/dL   Bilirubin Total 0.7  0.0 - 1.2 mg/dL   Alkaline Phosphatase 81 44 - 121 IU/L   LDH 218 121 - 224 IU/L   AST 20 0 - 40 IU/L   ALT 21 0 - 44 IU/L   GGT 18 0 - 65 IU/L   Iron 122 38 - 169 ug/dL   Cholesterol, Total 835 100 - 199 mg/dL   Triglycerides 77 0 - 149 mg/dL   HDL 37 (L) >60 mg/dL   VLDL Cholesterol Cal 15 5 - 40 mg/dL   LDL Chol Calc (NIH) 887 (H) 0 - 99 mg/dL   Chol/HDL Ratio 4.4 0.0 - 5.0 ratio    Comment:                                   T. Chol/HDL Ratio                                             Men  Women                               1/2 Avg.Risk  3.4    3.3                                   Avg.Risk  5.0    4.4                                2X Avg.Risk  9.6    7.1                                3X Avg.Risk 23.4   11.0    Estimated CHD Risk 0.9 0.0 - 1.0 times avg.    Comment: The CHD Risk is based on the T. Chol/HDL ratio. Other factors affect CHD Risk such as hypertension, smoking, diabetes, severe obesity, and family history of premature CHD.    TSH 1.790 0.450 - 4.500 uIU/mL   T4, Total 7.6 4.5 - 12.0 ug/dL   T3 Uptake Ratio 27 24 - 39 %   Free Thyroxine Index 2.1 1.2 - 4.9   Prostate Specific Ag, Serum 8.1 (H) 0.0 - 4.0 ng/mL    Comment: Roche ECLIA methodology. According to the American Urological Association, Serum PSA should decrease and remain at undetectable levels after radical prostatectomy. The AUA defines biochemical recurrence as an initial PSA value 0.2 ng/mL or greater followed by a subsequent confirmatory PSA value 0.2 ng/mL or greater. Values obtained with different assay methods or kits cannot be used interchangeably. Results cannot be interpreted as absolute evidence of the presence or absence of malignant disease.    WBC 9.2  3.4 - 10.8 x10E3/uL   RBC 5.65 4.14 - 5.80 x10E6/uL   Hemoglobin 16.5 13.0 - 17.7 g/dL   Hematocrit 50.1 62.4 - 51.0 %   MCV 88 79 - 97 fL   MCH 29.2 26.6 - 33.0 pg   MCHC 33.1 31.5 - 35.7 g/dL   RDW 86.2 88.3 - 84.5 %   Platelets 184 150 - 450 x10E3/uL   Neutrophils 60 Not  Estab. %   Lymphs 30 Not Estab. %   Monocytes 7 Not Estab. %   Eos 2 Not Estab. %   Basos 1 Not Estab. %   Neutrophils Absolute 5.5 1.4 - 7.0 x10E3/uL   Lymphocytes Absolute 2.7 0.7 - 3.1 x10E3/uL   Monocytes Absolute 0.7 0.1 - 0.9 x10E3/uL   EOS (ABSOLUTE) 0.2 0.0 - 0.4 x10E3/uL   Basophils Absolute 0.1 0.0 - 0.2 x10E3/uL   Immature Granulocytes 0 Not Estab. %   Immature Grans (Abs) 0.0 0.0 - 0.1 x10E3/uL  Testosterone      Status: None   Collection Time: 06/07/23  9:29 AM  Result Value Ref Range   Testosterone  412 264 - 916 ng/dL    Comment: Adult male reference interval is based on a population of healthy nonobese males (BMI <30) between 5 and 65 years old. Travison, et.al. JCEM [phone]. PMID: [id number].   Specimen status report     Status: None   Collection Time: 06/07/23  9:29 AM  Result Value Ref Range   specimen status report Comment     Comment: Written Authorization Written Authorization Written Authorization Received. Authorization received from Jenkins Sherry for [employer] on 06-13-2023 Logged by Joesph Ion   POCT urinalysis dipstick     Status: Abnormal   Collection Time: 06/07/23 10:48 AM  Result Value Ref Range   Color, UA dark yellow    Clarity, UA clear    Glucose, UA Negative Negative   Bilirubin, UA neg    Ketones, UA neg    Spec Grav, UA 1.015 1.010 - 1.025   Blood, UA trace -+ (A)    pH, UA 6.0 5.0 - 8.0   Protein, UA Negative Negative   Urobilinogen, UA 0.2 0.2 or 1.0 E.U./dL   Nitrite, UA neg    Leukocytes, UA Negative Negative   Appearance     Odor    Estradiol      Status: None   Collection Time: 06/13/23 10:37 AM  Result Value Ref Range   Estradiol   38.9 7.6 - 42.6 pg/mL    Comment: Roche ECLIA methodology  Vitamin B12     Status: None   Collection Time: 06/13/23 10:37 AM  Result Value Ref Range   Vitamin B-12 348 232 - 1,245 pg/mL  Hgb A1c w/o eAG     Status: Abnormal   Collection Time: 06/13/23 10:37 AM  Result Value Ref Range   Hgb A1c MFr Bld 7.2 (H) 4.8 - 5.6 %    Comment:          Prediabetes: 5.7 - 6.4          Diabetes: >6.4          Glycemic control for adults with diabetes: <7.0   VITAMIN D  25 Hydroxy (Vit-D Deficiency, Fractures)     Status: None   Collection Time: 06/13/23 10:37 AM  Result Value Ref Range   Vit D, 25-Hydroxy 31.8 30.0 - 100.0 ng/mL    Comment: Vitamin D  deficiency has been defined by the Institute of Medicine and an Endocrine Society practice guideline as a level of serum 25-OH vitamin D  less than 20 ng/mL (1,2). The Endocrine Society went on to further define vitamin D  insufficiency as a level between 21 and 29 ng/mL (2). 1. IOM (Institute of Medicine). 2010. Dietary reference    intakes for calcium and D. [place]: The    [employer]. 2. Holick MF, Binkley [place], Bischoff-Ferrari HA, et al.    Evaluation, treatment,  and prevention of vitamin D     deficiency: an Endocrine Society clinical practice    guideline. JCEM. 2011 Jul; 96(7):1911-30.     Radiology: DG Lumbar Spine 2-3 Views Result Date: 02/19/2020 CLINICAL DATA:  Left-sided low back pain radiating to left hip 1 year. Symptoms worse over the past 2 weeks. EXAM: LUMBAR SPINE - 2-3 VIEW COMPARISON:  None. FINDINGS: Subtle curvature of the lumbar spine convex right. There is moderate spondylosis of the lumbar spine to include moderate facet arthropathy over the lower lumbar spine. Vertebral body heights are maintained. Mild multilevel disc space narrowing throughout the lumbar spine. No compression fracture or significant spondylolisthesis. Degenerative changes of the sacroiliac joints. IMPRESSION: 1. No acute findings. 2.  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 Electronically Signed   By: Toribio Agreste M.D.   On: 02/19/2020 15:19    No results found.  No results found.    Assessment and Plan: Patient Active Problem List   Diagnosis Date Noted   OSA (obstructive sleep apnea) 07/09/2023   OSA on CPAP 06/28/2020   CPAP use counseling 06/28/2020   Morbid obesity (HCC) 06/28/2020   Class 2 severe obesity due to excess calories with serious comorbidity in adult [facility]) 02/17/2020   Hypogonadism in male 09/24/2018   HTN (hypertension) 09/24/2018   Increased prostate specific antigen (PSA) velocity 09/24/2018   IFG (impaired fasting glucose) 09/24/2018   Obesity 09/24/2018   Left ear hearing loss 09/24/2018   Allergic rhinitis 09/24/2018   Encounter for Department of Transportation (DOT) examination for driving license renewal 90/77/7979   History of colonic polyps 09/24/2018    1. OSA (obstructive sleep apnea) (Primary) The patient struggles to tolerate PAP and reports no benefit from PAP use. He admits he does not use it. When asked why he states it is because he feels like when the machine starts he does not get enough air. He has no ramp. We will increase him to APAP 10-20 and see how he tolerates that. He is encouraged to wear the cpap daily. He is encouraged to purchase new supplies. The patient was reminded how to clean equipment and advised to replace supplies routinely. The patient was also counselled on weight loss. The compliance is nil.   OSA- resume use of cpap at APAP 10-20 setting. Download 2 weeks, f/u 53m  2. CPAP use counseling CPAP Counseling: had a lengthy discussion with the patient regarding the importance of PAP therapy in management of the sleep apnea. Patient appears to understand the risk factor reduction and also understands the risks associated with untreated sleep apnea. Patient will try to make a good faith effort to remain compliant with  therapy. Also instructed the patient on proper cleaning of the device including the water must be changed daily if possible and use of distilled water is preferred. Patient understands that the machine should be regularly cleaned with appropriate recommended cleaning solutions that do not damage the PAP machine for example given white vinegar and water rinses. Other methods such as ozone treatment may not be as good as these simple methods to achieve cleaning.   3. Morbid obesity (HCC) Obesity Counseling: Had a lengthy discussion regarding patients BMI and weight issues. Patient was instructed on portion control as well as increased activity. Also discussed caloric restrictions with trying to maintain intake less than 2000 Kcal. Discussions were made in accordance with the 5As of weight management. Simple actions such as not eating late and if able to,  taking a walk is suggested.    General Counseling: I have discussed the findings of the evaluation and examination with Alm.  I have also discussed any further diagnostic evaluation thatmay be needed or ordered today. Alm verbalizes understanding of the findings of todays visit. We also reviewed his medications today and discussed drug interactions and side effects including but not limited excessive drowsiness and altered mental states. We also discussed that there is always a risk not just to him but also people around him. he has been encouraged to call the office with any questions or concerns that should arise related to todays visit.  No orders of the defined types were placed in this encounter.       I have personally obtained a history, examined the patient, evaluated laboratory and imaging results, formulated the assessment and plan and placed orders. This patient was seen today by Lauraine Lay, PA-C in collaboration with Dr. Elfreda Bathe.   Elfreda DELENA Bathe, MD [facility] Diplomate ABMS Pulmonary Critical Care Medicine and Sleep Medicine

## 2023-07-09 ENCOUNTER — Ambulatory Visit (INDEPENDENT_AMBULATORY_CARE_PROVIDER_SITE_OTHER): Admitting: Internal Medicine

## 2023-07-09 VITALS — BP 143/87 | HR 86 | Resp 16 | Ht 68.0 in | Wt 269.0 lb

## 2023-07-09 DIAGNOSIS — G4733 Obstructive sleep apnea (adult) (pediatric): Secondary | ICD-10-CM | POA: Diagnosis not present

## 2023-07-09 DIAGNOSIS — Z7189 Other specified counseling: Secondary | ICD-10-CM | POA: Diagnosis not present

## 2023-07-09 NOTE — Patient Instructions (Signed)

## 2023-10-04 ENCOUNTER — Encounter: Payer: Self-pay | Admitting: Physician Assistant

## 2023-10-04 ENCOUNTER — Ambulatory Visit: Admitting: Physician Assistant

## 2023-10-04 VITALS — BP 154/91 | HR 73 | Temp 97.8°F | Resp 14 | Ht 68.0 in | Wt 269.0 lb

## 2023-10-04 DIAGNOSIS — Z7689 Persons encountering health services in other specified circumstances: Secondary | ICD-10-CM

## 2023-10-04 NOTE — Progress Notes (Signed)
   Subjective: Return to work evaluation    Patient ID: Paul Pitts, male    DOB: 11-01-1960, 63 y.o.   MRN: [id number]  HPI Patient is here to complete paperwork for return to work evaluation.  Patient voices no concerns or complaints.  Patient works in [employer] and performs all  duties and his job description.  Patient has a history of chronic back and left hip pain over 3 years.  Patient states complaint was manage with physical therapy.   Review of Systems Chronic hip and back pain, hypertension, hypogonadism, obesity, and obstructive sleep apnea.    Objective:   Physical Exam BP 154/91  BP Location Left Arm  Patient Position Sitting  Cuff Size Large  Pulse Rate 73  Temp 97.8 F (36.6 C)  Temp Source Temporal  Weight 269 lb (122 kg)  Height 5' 8 (1.727 m)  Resp 14  SpO2 95 %   BP 154/91  BP Location Left Arm  Patient Position Sitting  Cuff Size Large  Pulse Rate 73  Temp 97.8 F (36.6 C)  Temp Source Temporal  Weight 269 lb (122 kg)  Height 5' 8 (1.727 m)  Resp 14  SpO2 95 %  HEENT is unremarkable. Neck is supple for lymphadenopathy or bruits. Lungs are clear to auscultation. Heart regular rate and rhythm. Abdomen distention secondary to body habitus, normoactive bowel sounds, soft nontender to palpation. No lumbar deformity.  Free and equal range of motion.       Assessment & Plan: Return to work evaluation   Patient return back to full duties.  Follow-up as needed.

## 2023-10-12 NOTE — Progress Notes (Unsigned)
 [facility] [address] [place], [place] [address]  Pulmonary Sleep Medicine   Office Visit Note  Patient Name: Paul Pitts DOB: 23-Jun-1960 MRN [id number]    Chief Complaint: Obstructive Sleep Apnea visit  Brief History:  Paul Pitts is seen today for a follow up visit for APAP@ 10-20 cmH2O. The patient has a 3.5 year history of sleep apnea. Patient is using PAP nightly.  The patient feels rested after sleeping with PAP.  The patient reports benefiting from PAP use. Reported sleepiness is improved and the Epworth Sleepiness Score is 3 out of 24. The patient does not take naps. The patient complains of the following: none. The compliance download shows 88% compliance with an average use time of 5 hours 36 minutes. The AHI is 0.8.  The patient does not complain of limb movements disrupting sleep. The patient continues to require PAP therapy in order to eliminate sleep apnea.   ROS  General: (-) fever, (-) chills, (-) night sweat Nose and Sinuses: (-) nasal stuffiness or itchiness, (-) postnasal drip, (-) nosebleeds, (-) sinus trouble. Mouth and Throat: (-) sore throat, (-) hoarseness. Neck: (-) swollen glands, (-) enlarged thyroid, (-) neck pain. Respiratory: +cough, - shortness of breath, - wheezing. Neurologic: - numbness, - tingling. Psychiatric: - anxiety, - depression   Current Medication: Outpatient Encounter Medications as of 10/15/2023  Medication Sig   amLODipine (NORVASC) 5 MG tablet Take 5 mg by mouth every morning.   Berberine Chloride 500 MG CAPS Take 500 mg by mouth in the morning and at bedtime.   dutasteride (AVODART) 0.5 MG capsule Take 0.5 mg by mouth daily.   Testosterone  20.25 MG/ACT (1.62%) GEL APP 3 PUMPS QAM ALTERNATING WITH 2 PUMPS QAM   No facility-administered encounter medications on file as of 10/15/2023.    Surgical History: No past surgical history on file.  Medical History: No past medical history on file.  Family History: Non  contributory to the present illness  Social History: Social History   Socioeconomic History   Marital status: Married    Spouse name: Not on file   Number of children: Not on file   Years of education: Not on file   Highest education level: Not on file  Occupational History   Not on file  Tobacco Use   Smoking status: Never   Smokeless tobacco: Never  Substance and Sexual Activity   Alcohol use: Yes   Drug use: Not on file   Sexual activity: Not on file  Other Topics Concern   Not on file  Social History Narrative   Not on file   Social Drivers of Health   Financial Resource Strain: Not on file  Food Insecurity: Not on file  Transportation Needs: Not on file  Physical Activity: Not on file  Stress: Not on file  Social Connections: Not on file  Intimate Partner Violence: Not on file    Vital Signs: Blood pressure (!) 162/83, pulse 71, resp. rate 16, height 5' 9 (1.753 m), weight 273 lb (123.8 kg), SpO2 97%. Body mass index is 40.32 kg/m.    Examination: General Appearance: The patient is well-developed, well-nourished, and in no distress. Neck Circumference: 49 cm Skin: Gross inspection of skin unremarkable. Head: normocephalic, no gross deformities. Eyes: no gross deformities noted. ENT: ears appear grossly normal Neurologic: Alert and oriented. No involuntary movements.  STOP BANG RISK ASSESSMENT S (snore) Have you been told that you snore?     NO   T (tired) Are you often  tired, fatigued, or sleepy during the Paul Pitts?   NO  O (obstruction) Do you stop breathing, choke, or gasp during sleep? NO   P (pressure) Do you have or are you being treated for high blood pressure? YES   B (BMI) Is your body index greater than 35 kg/m? YES   A (age) Are you 56 years old or older? YES   N (neck) Do you have a neck circumference greater than 16 inches?   YES   G (gender) Are you a male? YES   TOTAL STOP/BANG "YES" ANSWERS 5       A STOP-Bang score of 2 or less  is considered low risk, and a score of 5 or more is high risk for having either moderate or severe OSA. For people who score 3 or 4, doctors may need to perform further assessment to determine how likely they are to have OSA.         EPWORTH SLEEPINESS SCALE:  Scale:  (0)= no chance of dozing; (1)= slight chance of dozing; (2)= moderate chance of dozing; (3)= high chance of dozing  Chance  Situtation    Sitting and reading: 0    Watching TV: 1    Sitting Inactive in public: 0    As a passenger in car: 1      Lying down to rest: 0    Sitting and talking: 0    Sitting quielty after lunch: 1    In a car, stopped in traffic: 0   TOTAL SCORE:   3 out of 24    SLEEP STUDIES:  HST (12/04/19) RDI 30.6, low SPO2 74%    CPAP COMPLIANCE DATA:  Date Range: 08/11/2023-10/09/2023  Average Daily Use: 5 hours 36 minutes  Median Use: 5 hours 41 minutes  Compliance for > 4 Hours: 88%  AHI: 0.8 respiratory events per hour  Days Used: 58/60 days  Mask Leak: 1.4  95th Percentile Pressure: 17.1         LABS: No results found for this or any previous visit (from the past 2160 hours).  Radiology: DG Lumbar Spine 2-3 Views Result Date: 02/19/2020 CLINICAL DATA:  Left-sided low back pain radiating to left hip 1 year. Symptoms worse over the past 2 weeks. EXAM: LUMBAR SPINE - 2-3 VIEW COMPARISON:  None. FINDINGS: Subtle curvature of the lumbar spine convex right. There is moderate spondylosis of the lumbar spine to include moderate facet arthropathy over the lower lumbar spine. Vertebral body heights are maintained. Mild multilevel disc space narrowing throughout the lumbar spine. No compression fracture or significant spondylolisthesis. Degenerative changes of the sacroiliac joints. IMPRESSION: 1. No acute findings. 2. 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 Electronically Signed   By: Toribio Agreste M.D.   On: 02/19/2020 15:19    No  results found.  No results found.    Assessment and Plan: Patient Active Problem List   Diagnosis Date Noted   Chronic cough 10/15/2023   OSA (obstructive sleep apnea) 07/09/2023   OSA on CPAP 06/28/2020   CPAP use counseling 06/28/2020   Morbid obesity (HCC) 06/28/2020   Class 2 severe obesity due to excess calories with serious comorbidity in adult 02/17/2020   Hypogonadism in male 09/24/2018   HTN (hypertension) 09/24/2018   Increased prostate specific antigen (PSA) velocity 09/24/2018   IFG (impaired fasting glucose) 09/24/2018   Obesity 09/24/2018   Left ear hearing loss 09/24/2018   Allergic rhinitis 09/24/2018   Encounter for  Department of Transportation (DOT) examination for driving license renewal 90/77/7979   History of colonic polyps 09/24/2018    1. OSA (obstructive sleep apnea) (Primary) The patient does tolerate PAP and reports  benefit from PAP use. The patient was reminded how to clean equipment and advised to replace supplies routinely. The patient was also counselled on weight loss. The compliance is very good. The AHI is 0.8.   OSA on cpap- controlled. I changed his pressure last visit and encouraged him to resume use and he has done so and is feeling the benefit. Continue with compliance with pap. CPAP continues to be medically necessary to treat this patient's OSA. F/u one year.     2. CPAP use counseling CPAP Counseling: had a lengthy discussion with the patient regarding the importance of PAP therapy in management of the sleep apnea. Patient appears to understand the risk factor reduction and also understands the risks associated with untreated sleep apnea. Patient will try to make a good faith effort to remain compliant with therapy. Also instructed the patient on proper cleaning of the device including the water must be changed daily if possible and use of distilled water is preferred. Patient understands that the machine should be regularly cleaned with  appropriate recommended cleaning solutions that do not damage the PAP machine for example given white vinegar and water rinses. Other methods such as ozone treatment may not be as good as these simple methods to achieve cleaning.   3. Chronic cough Patient reports a chronic cough. He has tried inhalers to no avail. He denies gerd symptoms and phlegm. He is encouraged to discuss with his primary provider to request evaluation.    General Counseling: I have discussed the findings of the evaluation and examination with Alm.  I have also discussed any further diagnostic evaluation thatmay be needed or ordered today. Alm verbalizes understanding of the findings of todays visit. We also reviewed his medications today and discussed drug interactions and side effects including but not limited excessive drowsiness and altered mental states. We also discussed that there is always a risk not just to him but also people around him. he has been encouraged to call the office with any questions or concerns that should arise related to todays visit.  No orders of the defined types were placed in this encounter.       I have personally obtained a history, examined the patient, evaluated laboratory and imaging results, formulated the assessment and plan and placed orders. This patient was seen today by Lauraine Lay, PA-C in collaboration with Dr. Elfreda Bathe.   Elfreda DELENA Bathe, MD [facility] Diplomate ABMS Pulmonary Critical Care Medicine and Sleep Medicine

## 2023-10-15 ENCOUNTER — Ambulatory Visit (INDEPENDENT_AMBULATORY_CARE_PROVIDER_SITE_OTHER): Admitting: Internal Medicine

## 2023-10-15 VITALS — BP 162/83 | HR 71 | Resp 16 | Ht 69.0 in | Wt 273.0 lb

## 2023-10-15 DIAGNOSIS — Z7189 Other specified counseling: Secondary | ICD-10-CM | POA: Diagnosis not present

## 2023-10-15 DIAGNOSIS — G4733 Obstructive sleep apnea (adult) (pediatric): Secondary | ICD-10-CM

## 2023-10-15 DIAGNOSIS — R053 Chronic cough: Secondary | ICD-10-CM | POA: Insufficient documentation

## 2023-10-15 NOTE — Patient Instructions (Signed)

## 2024-01-17 NOTE — Progress Notes (Addendum)
 Paul Pitts                                          MRN: [id number]   01/17/2024   The [facility] Quality Team Specialist reviewed this patient medical record for the purposes of chart review for care gap closure. The following were reviewed: chart review for care gap closure-diabetic eye exam and kidney health evaluation for diabetes:eGFR  and uACR. No UACR labs found.    [facility] Quality Team
